# Patient Record
Sex: Female | Born: 1980 | Hispanic: Yes | Marital: Married | Smoking: Never smoker
Health system: Southern US, Community
[De-identification: ages and names within clinical notes are randomized; demographics above are authoritative.]

## PROBLEM LIST (undated history)

## (undated) ENCOUNTER — Inpatient Hospital Stay (HOSPITAL_COMMUNITY): Payer: Self-pay

## (undated) DIAGNOSIS — I10 Essential (primary) hypertension: Secondary | ICD-10-CM

## (undated) HISTORY — DX: Essential (primary) hypertension: I10

---

## 2010-06-06 ENCOUNTER — Encounter: Payer: Self-pay | Admitting: Geriatric Medicine

## 2010-11-09 ENCOUNTER — Other Ambulatory Visit: Payer: Self-pay | Admitting: Geriatric Medicine

## 2010-11-09 DIAGNOSIS — N63 Unspecified lump in unspecified breast: Secondary | ICD-10-CM

## 2010-11-13 ENCOUNTER — Ambulatory Visit
Admission: RE | Admit: 2010-11-13 | Discharge: 2010-11-13 | Disposition: A | Discharge status: Home / Self Care | Payer: Self-pay | Source: Ambulatory Visit | Attending: Geriatric Medicine | Admitting: Geriatric Medicine

## 2010-11-13 ENCOUNTER — Other Ambulatory Visit: Payer: Self-pay | Admitting: Geriatric Medicine

## 2010-11-13 DIAGNOSIS — N63 Unspecified lump in unspecified breast: Secondary | ICD-10-CM

## 2012-05-17 NOTE — L&D Delivery Note (Signed)
Attestation of Attending Supervision of Advanced Practitioner (PA/CNM/NP): Evaluation and management procedures were performed by the Advanced Practitioner under my supervision and collaboration.  I have reviewed the Advanced Practitioner's note and chart, and I agree with the management and plan.  Fahed Morten, MD, FACOG Attending Obstetrician & Gynecologist Faculty Practice, [facility]  

## 2012-05-17 NOTE — L&D Delivery Note (Signed)
Delivery Note Pt became complete and pushed well and at 9:27 PM a viable female was delivered via Vaginal, Spontaneous Delivery (Presentation: ; Occiput Anterior).  APGAR: 9, 9; weight: pending. Infant lifted to pt's abd and dried. Cord clamped and cut by FOB. Hospital cord blood sample collected.  Placenta status: Intact, Spontaneous.  Cord: 3 vessels   Anesthesia: Epidural  Episiotomy: None Lacerations: 1st degree;Perineal Suture Repair: 3.0 vicryl Est. Blood Loss (mL): 200  Mom to postpartum.  Baby to Couplet care / Skin to Skin.  Kari Thomas 04/11/2013, 10:04 PM

## 2012-09-18 LAB — OB RESULTS CONSOLE HEPATITIS B SURFACE ANTIGEN: Hepatitis B Surface Ag: NEGATIVE

## 2012-09-18 LAB — GLUCOSE, 1 HOUR: Glucose, 1 hour: 85

## 2012-09-18 LAB — OB RESULTS CONSOLE HGB/HCT, BLOOD: Hemoglobin: 13.1 g/dL

## 2012-09-18 LAB — OB RESULTS CONSOLE GC/CHLAMYDIA: Gonorrhea: NEGATIVE

## 2012-09-18 LAB — OB RESULTS CONSOLE ABO/RH: RH Type: NEGATIVE

## 2012-10-04 ENCOUNTER — Encounter: Payer: Self-pay | Admitting: *Deleted

## 2012-10-19 ENCOUNTER — Encounter: Payer: Self-pay | Admitting: Obstetrics & Gynecology

## 2012-10-19 ENCOUNTER — Ambulatory Visit (INDEPENDENT_AMBULATORY_CARE_PROVIDER_SITE_OTHER): Payer: Self-pay | Admitting: Obstetrics & Gynecology

## 2012-10-19 VITALS — BP 156/91 | Temp 97.9°F | Wt 205.8 lb

## 2012-10-19 DIAGNOSIS — O10019 Pre-existing essential hypertension complicating pregnancy, unspecified trimester: Secondary | ICD-10-CM

## 2012-10-19 DIAGNOSIS — O10012 Pre-existing essential hypertension complicating pregnancy, second trimester: Secondary | ICD-10-CM

## 2012-10-19 DIAGNOSIS — O0992 Supervision of high risk pregnancy, unspecified, second trimester: Secondary | ICD-10-CM

## 2012-10-19 LAB — POCT URINALYSIS DIP (DEVICE)
Leukocytes, UA: NEGATIVE
Nitrite: NEGATIVE
Protein, ur: NEGATIVE mg/dL
Urobilinogen, UA: 0.2 mg/dL (ref 0.0–1.0)
pH: 7 (ref 5.0–8.0)

## 2012-10-19 NOTE — Progress Notes (Signed)
Pulse- 102  Edema-feet  Pain-stomach, when nervous has chest pain Weight gain 11-20lbs New ob packet given BP Recheck 145/102

## 2012-10-19 NOTE — Patient Instructions (Signed)
Embarazo - Segundo trimestre (Pregnancy - Second Trimester) El segundo trimestre del embarazo (del 3 al 6 mes) es un perodo de evolucin rpida para usted y el beb. Hacia el final del sexto mes, el beb mide aproximadamente 23 cm y pesa 680 g. Comenzar a sentir los movimientos del beb entre las 18 y las 20 semanas de embarazo. Podr sentir las pataditas ("quickening en ingls"). Hay un rpido aumento de peso. Puede segregar un lquido claro (calostro) de las mamas. Quizs sienta pequeas contracciones en el vientre (tero) Esto se conoce como falso trabajo de parto o contracciones de Braxton-Hicks. Es como una prctica del trabajo de parto que se produce cuando el beb est listo para salir. Generalmente los problemas de vmitos matinales ya se han superado hacia el final del primer trimestre. Algunas mujeres desarrollan pequeas manchas oscuras (que se denominan cloasma, mscara del embarazo) en la cara que normalmente se van luego del nacimiento del beb. La exposicin al sol empeora las manchas. Puede desarrollarse acn en algunas mujeres embarazadas, y puede desaparecer en aquellas que ya tienen acn. EXAMENES PRENATALES  Durante los exmenes prenatales, deber seguir realizando pruebas de sangre, segn avance el embarazo. Estas pruebas se realizan para controlar su salud y la del beb. Tambin se realizan anlisis de sangre para conocer los niveles de hemoglobina. La anemia (bajo nivel de hemoglobina) es frecuente durante el embarazo. Para prevenirla, se administran hierro y vitaminas. Tambin se le realizarn exmenes para saber si tiene diabetes entre las 24 y las 28 semanas del embarazo. Podrn repetirle algunas de las pruebas que le hicieron previamente.  En cada visita le medirn el tamao del tero. Esto se realiza para asegurarse de que el beb est creciendo correctamente de acuerdo al estado del embarazo.  Tambin en cada visita prenatal controlarn su presin arterial. Esto se realiza  para asegurarse de que no tenga toxemia.  Se controlar su orina para asegurarse de que no tenga infecciones, diabetes o protena en la orina.  Se controlar su peso regularmente para asegurarse que el aumento ocurre al ritmo indicado. Esto se hace para asegurarse que usted y el beb tienen una evolucin normal.  En algunas ocasiones se realiza una prueba de ultrasonido para confirmar el correcto desarrollo y evolucin del beb. Esta prueba se realiza con ondas sonoras inofensivas para el beb, de modo que el profesional pueda calcular ms precisamente la fecha del parto. Algunas veces se realizan pruebas especializadas del lquido amnitico que rodea al beb. Esta prueba se denomina amniocentesis. El lquido amnitico se obtiene introduciendo una aguja en el vientre (abdomen). Se realiza para controlar los cromosomas en aquellos casos en los que existe alguna preocupacin acerca de algn problema gentico que pueda sufrir el beb. En ocasiones se lleva a cabo cerca del final del embarazo, si es necesario inducir al parto. En este caso se realiza para asegurarse que los pulmones del beb estn lo suficientemente maduros como para que pueda vivir fuera del tero. CAMBIOS QUE OCURREN EN EL SEGUNDO TRIMESTRE DEL EMBARAZO Su organismo atravesar numerosos cambios durante el embarazo. Estos pueden variar de una persona a otra. Converse con el profesional que la asiste acerca los cambios que usted note y que la preocupen.  Durante el segundo trimestre probablemente sienta un aumento del apetito. Es normal tener "antojos" de ciertas comidas. Esto vara de una persona a otra y de un embarazo a otro.  El abdomen inferior comenzar a abultarse.  Podr tener la necesidad de orinar con ms frecuencia debido a   que el tero y el beb presionan sobre la vejiga. Tambin es frecuente contraer ms infecciones urinarias durante el embarazo. Puede evitarlas bebiendo gran cantidad de lquidos y vaciando la vejiga antes y  despus de mantener relaciones sexuales.  Podrn aparecer las primeras estras en las caderas, abdomen y mamas. Estos son cambios normales del cuerpo durante el embarazo. No existen medicamentos ni ejercicios que puedan prevenir estos cambios.  Es posible que comience a desarrollar venas inflamadas y abultadas (varices) en las piernas. El uso de medias de descanso, elevar sus pies durante 15 minutos, 3 a 4 veces al da y limitar la sal en su dieta ayuda a aliviar el problema.  Podr sentir acidez gstrica a medida que el tero crece y presiona contra el estmago. Puede tomar anticidos, con la autorizacin de su mdico, para aliviar este problema. Tambin es til ingerir pequeas comidas 4 a 5 veces al da.  La constipacin puede tratarse con un laxante o agregando fibra a su dieta. Beber grandes cantidades de lquidos, comer vegetales, frutas y granos integrales es de gran ayuda.  Tambin es beneficioso practicar actividad fsica. Si ha sido una persona activa hasta el embarazo, podr continuar con la mayora de las actividades durante el mismo. Si ha sido menos activa, puede ser beneficioso que comience con un programa de ejercicios, como realizar caminatas.  Puede desarrollar hemorroides hacia el final del segundo trimestre. Tomar baos de asiento tibios y utilizar cremas recomendadas por el profesional que lo asiste sern de ayuda para los problemas de hemorroides.  Tambin podr sentir dolor de espalda durante este momento de su embarazo. Evite levantar objetos pesados, utilice zapatos de taco bajo y mantenga una buena postura para ayudar a reducir los problemas de espalda.  Algunas mujeres embarazadas desarrollan hormigueo y adormecimiento de la mano y los dedos debido a la hinchazn y compresin de los ligamentos de la mueca (sndrome del tnel carpiano). Esto desaparece una vez que el beb nace.  Como sus pechos se agrandan, necesitar un sujetador ms grande. Use un sostn de soporte,  cmodo y de algodn. No utilice un sostn para amamantar hasta el ltimo mes de embarazo si va a amamantar al beb.  Podr observar una lnea oscura desde el ombligo hacia la zona pbica denominada linea nigra.  Podr observar que sus mejillas se ponen coloradas debido al aumento de flujo sanguneo en la cara.  Podr desarrollar "araitas" en la cara, cuello y pecho. Esto desaparece una vez que el beb nace. INSTRUCCIONES PARA EL CUIDADO DOMICILIARIO  Es extremadamente importante que evite el cigarrillo, hierbas medicinales, alcohol y las drogas no prescriptas durante el embarazo. Estas sustancias qumicas afectan la formacin y el desarrollo del beb. Evite estas sustancias durante todo el embarazo para asegurar el nacimiento de un beb sano.  La mayor parte de los cuidados que se aconsejan son los mismos que los indicados para el primer trimestre del embarazo. Cumpla con las citas tal como se le indic. Siga las instrucciones del profesional que lo asiste con respecto al uso de los medicamentos, el ejercicio y la dieta.  Durante el embarazo debe obtener nutrientes para usted y para su beb. Consuma alimentos balanceados a intervalos regulares. Elija alimentos como carne, pescado, leche y otros productos lcteos descremados, vegetales, frutas, panes integrales y cereales. El profesional le informar cul es el aumento de peso ideal.  Las relaciones sexuales fsicas pueden continuarse hasta cerca del fin del embarazo si no existen otros problemas. Estos problemas pueden ser la prdida temprana (  prematura) de lquido amnitico de las membranas, sangrado vaginal, dolor abdominal u otros problemas mdicos o del embarazo.  Realice actividad fsica todos los das, si no tiene restricciones. Consulte con el profesional que la asiste si no sabe con certeza si determinados ejercicios son seguros. El mayor aumento de peso tiene lugar durante los ltimos 2 trimestres del embarazo. El ejercicio la ayudar  a:  Controlar su peso.  Ponerla en forma para el parto.  Ayudarla a perder peso luego de haber dado a luz.  Use un buen sostn o como los que se usan para hacer deportes para aliviar la sensibilidad de las mamas. Tambin puede serle til si lo usa mientras duerme. Si pierde calostro, podr utilizar apsitos en el sostn.  No utilice la baera con agua caliente, baos turcos y saunas durante el embarazo.  Utilice el cinturn de seguridad sin excepcin cuando conduzca. Este la proteger a usted y al beb en caso de accidente.  Evite comer carne cruda, queso crudo, y el contacto con los utensilios y desperdicios de los gatos. Estos elementos contienen grmenes que pueden causar defectos de nacimiento en el beb.  El segundo trimestre es un buen momento para visitar a su dentista y evaluar su salud dental si an no lo ha hecho. Es importante mantener los dientes limpios. Utilice un cepillo de dientes blando. Cepllese ms suavemente durante el embarazo.  Es ms fcil perder algo de orina durante el embarazo. Apretar y fortalecer los msculos de la pelvis la ayudar con este problema. Practique detener la miccin cuando est en el bao. Estos son los mismos msculos que necesita fortalecer. Son tambin los mismos msculos que utiliza cuando trata de evitar los gases. Puede practicar apretando estos msculos 10 veces, y repetir esto 3 veces por da aproximadamente. Una vez que conozca qu msculos debe apretar, no realice estos ejercicios durante la miccin. Puede favorecerle una infeccin si la orina vuelve hacia atrs.  Pida ayuda si tiene necesidades econmicas, de asesoramiento o nutricionales durante el embarazo. El profesional podr ayudarla con respecto a estas necesidades, o derivarla a otros especialistas.  La piel puede ponerse grasa. Si esto sucede, lvese la cara con un jabn suave, utilice un humectante no graso y maquillaje con base de aceite o crema. CONSUMO DE MEDICAMENTOS Y DROGAS  DURANTE EL EMBARAZO  Contine tomando las vitaminas apropiadas para esta etapa tal como se le indic. Las vitaminas deben contener un miligramo de cido flico y deben suplementarse con hierro. Guarde todas las vitaminas fuera del alcance de los nios. La ingestin de slo un par de vitaminas o tabletas que contengan hierro puede ocasionar la muerte en un beb o en un nio pequeo.  Evite el uso de medicamentos, inclusive los de venta libre y hierbas que no hayan sido prescriptos o indicados por el profesional que la asiste. Algunos medicamentos pueden causar problemas fsicos al beb. Utilice los medicamentos de venta libre o de prescripcin para el dolor, el malestar o la fiebre, segn se lo indique el profesional que lo asiste. No utilice aspirina.  El consumo de alcohol est relacionado con ciertos defectos de nacimiento. Esto incluye el sndrome de alcoholismo fetal. Debe evitar el consumo de alcohol en cualquiera de sus formas. El cigarrillo causa nacimientos prematuros y bebs de bajo peso. El uso de drogas recreativas est absolutamente prohibido. Son muy nocivas para el beb. Un beb que nace de una madre adicta, ser adicto al nacer. Ese beb tendr los mismos sntomas de abstinencia que un adulto.    Infrmele al profesional si consume alguna droga.  No consuma drogas ilegales. Pueden causarle mucho dao al beb. SOLICITE ATENCIN MDICA SI: Tiene preguntas o preocupaciones durante su embarazo. Es mejor que llame para consultar las dudas que esperar hasta su prxima visita prenatal. De esta forma se sentir ms tranquila.  SOLICITE ATENCIN MDICA DE INMEDIATO SI:  La temperatura oral se eleva sin motivo por encima de 102 F (38.9 C) o segn le indique el profesional que lo asiste.  Tiene una prdida de lquido por la vagina (canal de parto). Si sospecha una ruptura de las membranas, tmese la temperatura y llame al profesional para informarlo sobre esto.  Observa unas pequeas manchas,  una hemorragia vaginal o elimina cogulos. Notifique al profesional acerca de la cantidad y de cuntos apsitos est utilizando. Unas pequeas manchas de sangre son algo comn durante el embarazo, especialmente despus de mantener relaciones sexuales.  Presenta un olor desagradable en la secrecin vaginal y observa un cambio en el color, de transparente a blanco.  Contina con las nuseas y no obtiene alivio de los remedios indicados. Vomita sangre o algo similar a la borra del caf.  [place] o sube ms de 900 g. en una semana, o segn lo indicado por el profesional que la asiste.  Observa que se le hinchan el rostro, las manos, los pies o las piernas.  Ha estado expuesta a la rubola y no ha sufrido la enfermedad.  Ha estado expuesta a la quinta enfermedad o a la varicela.  Presenta dolor abdominal. Las molestias en el ligamento redondo son una causa no cancerosa (benigna) frecuente de dolor abdominal durante el embarazo. El profesional que la asiste deber evaluarla.  Presenta dolor de cabeza intenso que no se alivia.  Presenta fiebre, diarrea, dolor al orinar o le falta la respiracin.  Presenta dificultad para ver, visin borrosa, o visin doble.  Sufre una cada, un accidente de trnsito o cualquier tipo de trauma.  Vive en un hogar en el que existe violencia fsica o mental. Document Released: 02/10/2005 Document Revised: 01/26/2012 [employer] Patient Information 2014 [employer].  

## 2012-10-19 NOTE — Progress Notes (Signed)
Nutrition Note:  1st visit Pt. Seen for GDM and HTN education Wt. Loss during pregnancy.  Weighed 205.8# today; up slightly from [facility] visit 09/07/12. Pt. Given verbal and written GDM education.  N/V reported.  No food allergies.  Eats 3 meals and 2-3 snacks/day.  Craving sweets.  Drinks water daily. Discussed importance of eating 3 meals and 2-3 snacks daily; limiting sugar intake and sodium through processed foods. Discussed weight gain goals of 11-20# F/U in 2 weeks. Candice C. Earlene Plater, MPH, RD, LDN

## 2012-10-19 NOTE — Progress Notes (Signed)
U/S scheduled 10/26/12 at 945 am.

## 2012-10-19 NOTE — Progress Notes (Signed)
Transferred from [facility] with history of HTN, no meds. BP elevated today. Will recheck next visit to consider antihypertensive. Needs [place] for dating, LMP unsure and menses irregular  Subjective: [facility] referrall    Kari Thomas Shon Hale is a G2P1001 [redacted]w[redacted]d being seen today for her first obstetrical visit.  Her obstetrical history is significant for hypertension, not on meds. Patient does intend to breast feed. Pregnancy history fully reviewed.  Patient reports nausea.  Filed Vitals:   10/19/12 0819  BP: 156/91  Temp: 97.9 F (36.6 C)  Weight: 205 lb 12.8 oz (93.35 kg)    HISTORY: OB History   Grav Para Term Preterm Abortions TAB SAB Ect Mult Living   2 1 1       1      # Outc Date GA Lbr Len/2nd Wgt Sex Del Anes PTL Lv   1 TRM 9/06 [redacted]w[redacted]d 13:00 6lb12oz(3.062kg) M SVD EPI  Yes   Comments: chest pain; pre-eclampsia   2 CUR              Past Medical History  Diagnosis Date  . Hypertension   . Gestational diabetes 2014   History reviewed. No pertinent past surgical history. Family History  Problem Relation Age of Onset  . Diabetes Mother   . Heart disease Mother   . Cancer Father     liver cancer     Exam   Filed Vitals:   10/19/12 0819  BP: 156/91  Temp: 97.9 F (36.6 C)  Weight: 205 lb 12.8 oz (93.35 kg)     Uterus:     Pelvic Exam:                                    Skin: normal coloration and turgor, no rashes    Neurologic: oriented, normal, normal mood   Extremities: normal strength, tone, and muscle mass, no deformities   HEENT neck supple with midline trachea   Mouth/Teeth dental hygiene good   Neck supple   Cardiovascular: Normal effort       Abdomen: obese, not tender, 15 week size     Assessment:    Pregnancy: G2P1001 Patient Active Problem List   Diagnosis Date Noted  . Supervision of high risk pregnancy in second trimester 10/19/2012  . Hypertension, essential, complicating pregnancy 10/19/2012        Plan:     Initial labs  drawn. Prenatal vitamins. Problem list reviewed and updated. Genetic Screening discussed First Screen: discussed, needs dating [place].  Ultrasound discussed; fetal survey: requested.  Follow up in 2 weeks. 50% of 30 min visit spent on counseling and coordination of care.  May need BP med   Lachele Lievanos 10/19/2012

## 2012-10-26 ENCOUNTER — Ambulatory Visit (HOSPITAL_COMMUNITY)
Admission: RE | Admit: 2012-10-26 | Discharge: 2012-10-26 | Disposition: A | Discharge status: Home / Self Care | Payer: Self-pay | Source: Ambulatory Visit | Attending: Obstetrics & Gynecology | Admitting: Obstetrics & Gynecology

## 2012-10-26 ENCOUNTER — Other Ambulatory Visit: Payer: Self-pay | Admitting: Obstetrics & Gynecology

## 2012-10-26 DIAGNOSIS — Z3689 Encounter for other specified antenatal screening: Secondary | ICD-10-CM | POA: Insufficient documentation

## 2012-10-26 DIAGNOSIS — O10012 Pre-existing essential hypertension complicating pregnancy, second trimester: Secondary | ICD-10-CM

## 2012-10-26 DIAGNOSIS — O0992 Supervision of high risk pregnancy, unspecified, second trimester: Secondary | ICD-10-CM

## 2012-10-26 DIAGNOSIS — O99810 Abnormal glucose complicating pregnancy: Secondary | ICD-10-CM | POA: Insufficient documentation

## 2012-10-26 DIAGNOSIS — O10019 Pre-existing essential hypertension complicating pregnancy, unspecified trimester: Secondary | ICD-10-CM | POA: Insufficient documentation

## 2012-11-02 ENCOUNTER — Ambulatory Visit (INDEPENDENT_AMBULATORY_CARE_PROVIDER_SITE_OTHER): Payer: Self-pay | Admitting: Family Medicine

## 2012-11-02 ENCOUNTER — Encounter: Payer: Self-pay | Admitting: Family Medicine

## 2012-11-02 VITALS — BP 120/77 | Temp 99.3°F | Wt 206.0 lb

## 2012-11-02 DIAGNOSIS — O0992 Supervision of high risk pregnancy, unspecified, second trimester: Secondary | ICD-10-CM

## 2012-11-02 DIAGNOSIS — O10019 Pre-existing essential hypertension complicating pregnancy, unspecified trimester: Secondary | ICD-10-CM

## 2012-11-02 DIAGNOSIS — O10012 Pre-existing essential hypertension complicating pregnancy, second trimester: Secondary | ICD-10-CM

## 2012-11-02 LAB — POCT URINALYSIS DIP (DEVICE)
Bilirubin Urine: NEGATIVE
Glucose, UA: NEGATIVE mg/dL
Ketones, ur: NEGATIVE mg/dL
Nitrite: NEGATIVE
Specific Gravity, Urine: 1.02 (ref 1.005–1.030)

## 2012-11-02 NOTE — Progress Notes (Signed)
P=93, c/o pelvic pressure sometimes, used interpreter Dorita,

## 2012-11-02 NOTE — Progress Notes (Signed)
BP better Quad screen today Anatomy scheduled Baseline labs for HTN

## 2012-11-02 NOTE — Patient Instructions (Signed)
Embarazo - Segundo trimestre (Pregnancy - Second Trimester) El segundo trimestre del Psychiatrist (del 3 al 6 mes) es un perodo de evolucin rpida para usted y el beb. Hacia el final del sexto mes, el beb mide aproximadamente 23 cm y pesa 680 g. Comenzar a Pharmacologist del beb [place] 18 y las 20 [address] de [place]. Podr sentir las pataditas ("quickening en ingls"). Hay un rpido [employer]. Puede segregar un lquido claro Charity fundraiser) de las [place]. Quizs sienta pequeas contracciones en el vientre (tero) Esto se conoce como falso trabajo de parto o contracciones de Braxton-Hicks. Es como una prctica del trabajo de parto que se produce cuando el beb est listo para salir. Generalmente los problemas de vmitos matinales ya se han superado hacia el final del Medical sales representative. Algunas mujeres desarrollan pequeas manchas oscuras (que se denominan cloasma, mscara del embarazo) en la cara que normalmente se van luego del nacimiento del beb. La exposicin al sol empeora las manchas. Puede desarrollarse acn en algunas mujeres embarazadas, y puede desaparecer en aquellas que ya tienen acn. EXAMENES PRENATALES  Durante los Manpower Inc, deber seguir realizando pruebas de [place], segn avance el [place]. Estas pruebas se realizan para controlar su salud y la del beb. Tambin se realizan anlisis de sangre para [employer] niveles de Mark. La anemia (bajo nivel de hemoglobina) es frecuente durante el embarazo. Para prevenirla, se administran hierro y vitaminas. Tambin se le realizarn exmenes para saber si tiene diabetes entre las 24 y las 28 semanas del Realitos. Podrn repetirle algunas de las Hovnanian Enterprises hicieron previamente.  En cada visita le medirn el tamao del tero. Esto se realiza para asegurarse de que el beb est creciendo correctamente de acuerdo al estado del [place].  Tambin en cada visita prenatal controlarn su presin arterial. Esto se realiza  para asegurarse de que no tenga toxemia.  Se controlar su orina para asegurarse de que no tenga infecciones, diabetes o protena en la orina.  Se controlar su peso regularmente para asegurarse que el aumento ocurre al ritmo indicado. Esto se hace para asegurarse que usted y el beb tienen una evolucin normal.  En algunas ocasiones se realiza una prueba de ultrasonido para confirmar el correcto desarrollo y evolucin del beb. Esta prueba se realiza con ondas sonoras inofensivas para el beb, de modo que el profesional pueda calcular ms precisamente la fecha del [place]. Algunas veces se realizan pruebas especializadas del lquido amnitico que rodea al beb. Esta prueba se denomina amniocentesis. El lquido amnitico se obtiene introduciendo una aguja en el vientre (abdomen). Se realiza para Conservator, museum/gallery en los que existe alguna preocupacin acerca de algn problema gentico que pueda sufrir el beb. En ocasiones se lleva a cabo cerca del final del embarazo, si es necesario inducir al Apple Computer. En este caso se realiza para asegurarse que los pulmones del beb estn lo suficientemente maduros como para que pueda vivir fuera del tero. CAMBIOS QUE OCURREN EN EL SEGUNDO TRIMESTRE DEL EMBARAZO Su organismo atravesar numerosos cambios durante el Big Lots. Estos pueden variar de Neomia Dear persona a otra. [place] con el profesional que la asiste acerca los cambios que usted note y que la preocupen.  Durante el segundo trimestre probablemente sienta un aumento del apetito. Es normal tener "antojos" de Development worker, community. Esto vara de Neomia Dear persona a otra y de un embarazo a Therapist, art.  El abdomen inferior comenzar a abultarse.  Podr tener la necesidad de Geographical information systems officer con ms frecuencia debido a  que el tero y el beb presionan sobre la vejiga. Tambin es frecuente contraer ms infecciones urinarias durante el [employer] Lots. Puede evitarlas bebiendo gran cantidad de lquidos y vaciando la vejiga antes y  despus de Sales promotion account executive.  Podrn aparecer las primeras estras en las caderas, abdomen y [place]. Estos son cambios normales del cuerpo durante el [place]. No existen medicamentos ni ejercicios que puedan prevenir CarMax.  Es posible que comience a desarrollar venas inflamadas y abultadas (varices) en las piernas. El uso de medias de descanso, Optometrist sus pies durante 15 minutos, 3 a 4 veces al da y Film/video editor la sal en su dieta ayuda a Journalist, newspaper.  Podr sentir Engineering geologist gstrica a medida que el tero crece y Doctor, general practice. Puede tomar anticidos, con la autorizacin de su mdico, para Financial planner. Tambin es til ingerir pequeas comidas 4 a 5 veces al Futures trader.  La constipacin puede tratarse con un laxante o agregando fibra a su dieta. Beber grandes cantidades de lquidos, comer vegetales, frutas y granos integrales es de [place].  Tambin es beneficioso practicar actividad fsica. Si ha sido una persona Engineer, mining, podr continuar con la Harley-Davidson de las actividades durante el mismo. Si ha sido [employer], puede ser beneficioso que comience con un programa de ejercicios, Museum/gallery exhibitions officer.  Puede desarrollar hemorroides hacia el final del segundo trimestre. Tomar baos de asiento tibios y Chemical engineer cremas recomendadas por el profesional que lo asiste sern de ayuda para los problemas de hemorroides.  Tambin podr Financial risk analyst de espalda durante este momento de su embarazo. Evite levantar objetos pesados, utilice zapatos de taco bajo y [place] buena postura para ayudar a reducir los problemas de [place].  Algunas mujeres embarazadas desarrollan hormigueo y adormecimiento de la mano y los dedos debido a la hinchazn y compresin de los ligamentos de la mueca (sndrome del tnel carpiano). Esto desaparece una vez que el beb nace.  Como sus pechos se agrandan, Pension scheme manager un sujetador ms grande. Use un sostn de soporte,  cmodo y de algodn. No utilice un sostn para amamantar hasta el ltimo mes de embarazo si va a amamantar al beb.  Podr observar una lnea oscura desde el ombligo hacia la zona pbica denominada linea nigra.  Podr observar que sus mejillas se ponen coloradas debido al aumento de flujo sanguneo en la cara.  Podr desarrollar "araitas" en la cara, cuello y pecho. Esto desaparece una vez que el beb nace. INSTRUCCIONES PARA EL CUIDADO DOMICILIARIO  Es extremadamente importante que evite el cigarrillo, hierbas medicinales, alcohol y las drogas no prescriptas durante el Psychiatrist. Estas sustancias qumicas afectan la formacin y el desarrollo del beb. Evite estas sustancias durante todo el embarazo para asegurar el nacimiento de un beb sano.  La mayor parte de los cuidados que se aconsejan son los mismos que los indicados para Financial risk analyst trimestre del Psychiatrist. Cumpla con las citas tal como se le indic. Siga las instrucciones del profesional que lo asiste con respecto al uso de los medicamentos, el ejercicio y Psychologist, forensic.  Durante el embarazo debe obtener nutrientes para usted y para su beb. Consuma alimentos balanceados a intervalos regulares. Elija alimentos como carne, pescado, [place] y otros productos lcteos descremados, vegetales, frutas, panes integrales y cereales. El Equities trader cul es el aumento de peso ideal.  Las relaciones sexuales fsicas pueden continuarse hasta cerca del fin del embarazo si no existen otros problemas. Estos problemas pueden ser la prdida temprana (  prematura) de lquido amnitico de las [place], sangrado vaginal, dolor abdominal u otros problemas mdicos o del Psychiatrist.  Realice Tesoro Corporation, si no tiene restricciones. Consulte con el profesional que la asiste si no sabe con certeza si determinados ejercicios son seguros. El mayor aumento de peso tiene Environmental consultant durante los ltimos 2 trimestres del Psychiatrist. El ejercicio la ayudar  a:  Engineering geologist.  Ponerla en forma para el parto.  Ayudarla a perder peso luego de haber dado a luz.  Use un buen sostn o como los que se usan para hacer deportes para Paramedic la sensibilidad de las [place]. Tambin puede serle til si lo [place] mientras duerme. Si pierde Product manager, podr Parker Hann[employer]in.  No utilice la baera con agua caliente, baos turcos y saunas durante el [address]  Utilice el cinturn de seguridad sin excepcin cuando conduzca. Este la proteger a usted y al beb en caso de accidente.  Evite comer carne cruda, queso crudo, y el contacto con los utensilios y desperdicios de los gatos. Estos elementos contienen grmenes que pueden causar defectos de nacimiento en el beb.  El segundo trimestre es un buen momento para visitar a su dentista y Software engineer si an no lo ha hecho. Es Primary school teacher los dientes limpios. Utilice un cepillo de dientes blando. Cepllese ms suavemente durante el embarazo.  Es ms fcil perder algo de orina durante el [place]. Apretar y Chief Operating Officer los msculos de la pelvis la ayudar con este problema. Practique detener la miccin cuando est en el bao. Estos son los mismos msculos que Development worker, international aid. Son TEPP[employer] Partners mismos msculos que utiliza cuando trata de Ryder System gases. Puede practicar apretando estos msculos 10 veces, y repetir esto 3 veces por da aproximadamente. Una vez que conozca qu msculos debe apretar, no realice estos ejercicios durante la miccin. Puede favorecerle una infeccin si la orina vuelve hacia atrs.  Pida ayuda si tiene necesidades econmicas, de asesoramiento o nutricionales durante el [place]. El profesional podr ayudarla con respecto a estas necesidades, o derivarla a otros especialistas.  La piel puede ponerse grasa. Si esto sucede, lvese la cara con un jabn [place], utilice un humectante no graso y [place] con base de aceite o crema. CONSUMO DE MEDICAMENTOS Y DROGAS  DURANTE EL EMBARAZO  Contine tomando las vitaminas apropiadas para esta etapa tal como se le indic. Las vitaminas deben contener un miligramo de cido flico y deben suplementarse con hierro. Guarde todas las vitaminas fuera del alcance de los nios. La ingestin de slo un par de vitaminas o tabletas que contengan hierro puede ocasionar la Newmont Mining en un beb o en un nio pequeo.  Evite el uso de [place], inclusive los de venta libre y hierbas que no hayan sido prescriptos o indicados por el profesional que la asiste. Algunos medicamentos pueden causar problemas fsicos al beb. Utilice los medicamentos de venta libre o de prescripcin para Chief Technology Officer, Environmental health practitioner o la [place], segn se lo indique el profesional que lo asiste. No utilice aspirina.  El consumo de alcohol est relacionado con ciertos defectos de nacimiento. Esto incluye el sndrome de alcoholismo fetal. Debe evitar el consumo de alcohol en cualquiera de sus formas. El cigarrillo causa nacimientos prematuros y bebs de [place]. El uso de drogas recreativas est absolutamente prohibido. Son muy nocivas para el beb. Un beb que nace de [employer] Express, ser adicto al nacer. Ese beb tendr los mismos sntomas de abstinencia que un adulto.  Infrmele al profesional si consume alguna droga.  No consuma drogas ilegales. Pueden causarle mucho dao al beb. SOLICITE ATENCIN MDICA SI: Tiene preguntas o preocupaciones durante su embarazo. Es mejor que llame para Science writer las dudas que esperar hasta su prxima visita prenatal. Thressa Sheller forma se sentir ms tranquila.  SOLICITE ATENCIN MDICA DE INMEDIATO SI:  La temperatura oral se eleva sin motivo por encima de 102 F (38.9 C) o segn le indique el profesional que lo asiste.  Tiene una prdida de lquido por la vagina (canal de parto). Si sospecha una ruptura de las [place], tmese la temperatura y llame al profesional para informarlo sobre esto.  Observa unas pequeas manchas,  una hemorragia vaginal o elimina cogulos. Notifique al profesional acerca de la cantidad y de cuntos apsitos est utilizando. Unas pequeas manchas de sangre son algo comn durante el Psychiatrist, especialmente despus de Sales promotion account executive.  Presenta un olor desagradable en la secrecin vaginal y observa un cambio en el color, de transparente a blanco.  Contina con las nuseas y no obtiene alivio de los remedios indicados. Vomita sangre o algo similar a la borra del caf.  [place] o sube ms de 900 g. en una semana, o segn lo indicado por el profesional que la asiste.  Observa que se le Southwest Airlines, las manos, los pies o las piernas.  Ha estado expuesta a la rubola y no ha sufrido la enfermedad.  Ha estado expuesta a la quinta enfermedad o a la varicela.  Presenta dolor abdominal. Las molestias en el ligamento redondo son Neomia Dear causa no cancerosa (benigna) frecuente de dolor abdominal durante el embarazo. El profesional que la asiste deber evaluarla.  Presenta dolor de cabeza intenso que no se [place].  Presenta fiebre, diarrea, dolor al orinar o le falta la respiracin.  Presenta dificultad para ver, visin borrosa, o visin doble.  Sufre una cada, un accidente de trnsito o cualquier tipo de trauma.  Vive en un hogar en el que existe violencia fsica o mental. Document Released: 02/10/2005 Document Revised: 01/26/2012 [facility] Patient Information 2014 [place], [place].  Lactancia materna  (Breastfeeding)  El cambio hormonal durante el Psychiatrist produce el desarrollo del tejido Dean y un aumento en el nmero y tamao de los conductos galactforos. La hormona prolactina permite que las protenas, los azcares y las grasas de la sangre produzcan la [employer] materna en las glndulas productoras de [place]. La hormona progesterona impide que la leche materna sea liberada antes del nacimiento del beb. Despus del nacimiento del beb, su nivel de progesterona disminuye  permitiendo que la leche materna sea [place]. Pensar en el beb, as como la succin o Theatre manager, pueden estimular la liberacin de [place] de las glndulas productoras de [place].  La decisin de Company secretary) es una de las mejores opciones que usted puede hacer para usted y su beb. La informacin que sigue da una breve resea de los beneficios, as Lexicographer que debe saber sobre la [place].  LOS BENEFICIOS DE AMAMANTAR  Para el beb   La primera leche (calostro) ayuda al mejor funcionamiento del sistema digestivo del beb.   La leche tiene anticuerpos que provienen de la madre y que ayudan a prevenir las infecciones en el beb.   El beb tiene una menor incidencia de asma, alergias y del sndrome de muerte sbita del lactante (SMSL).   Los nutrientes de la [place] materna son mejores para el beb que la [place].  La leche materna mejora  el desarrollo cerebral del beb.   Su beb tendr menos gases, clicos y estreimiento.  Es menos probable que el beb desarrolle otras enfermedades, como obesidad infantil, asma o diabetes mellitus. Para usted   La lactancia materna favorece el desarrollo de un vnculo muy especial entre la madre y el beb.   Es ms conveniente, siempre disponible, a la [place] y [place].   La lactancia materna ayuda a quemar caloras y a perder el peso ganado durante el [place].   Hace que el tero se contraiga ms rpidamente a su tamao normal y Consolidated Edison sangrado despus del [place].   Las [employer] Holdings que amamantan tienen menos riesgo de Environmental education officer osteoporosis o cncer de mama o de ovario en el futuro.  FRECUENCIA DEL AMAMANTAMIENTO   Un beb sano, nacido a trmino, puede amamantarse con tanta frecuencia como cada hora, o espaciar las comidas cada tres horas. La frecuencia en la lactancia varan de un beb a otro.   Los recin nacidos deben ser alimentados por lo menos cada 2-3 horas  Administrator y cada 4-5 horas durante la noche. Usted debe amamantarlo un mnimo de 8 tomas en un perodo de 24 horas.  Despierte al beb para amamantarlo si han pasado 3-4 horas desde la ltima comida.  Amamante cuando sienta la necesidad de reducir la plenitud de sus senos o cuando el beb muestre signos de [place]. Las seales de que el beb puede Gentry Fitz son:  Lenora Boys su estado de alerta o vigilancia.  Se estira.  Mueve la cabeza de un lado a otro.  Mueve la cabeza y abre la boca cuando se le toca la mejilla o la boca (reflejo de succin).  Aumenta las vocalizaciones, tales como sonidos de succin, relamerse los labios, arrullos, suspiros, o chirridos.  Mueve la [employer] boca.  Se chupa con ganas los dedos o las manos.  Agitacin.  Llanto intermitente.  Los signos de hambre extrema requerirn que lo calme y lo consuele antes de tratar de alimentarlo. Los signos de hambre extrema son:  Agitacin.  Llanto fuerte e intenso.  Gritos.  El amamantamiento frecuente la ayudar a producir ms [place] y a Education officer, community de Engineer, mining en los pezones e hinchazn de las [place].  LACTANCIA MATERNA   Ya sea que se encuentre acostada o sentada, asegrese que el abdomen del beb est enfrente el suyo.   Sostenga la mama con el pulgar por arriba y los otros 4 dedos por debajo del pezn. Asegrese que sus dedos se encuentren lejos del pezn y de la boca del beb.   Empuje suavemente los labios del beb con el pezn o con el dedo.   Cuando la boca del beb se abra lo suficiente, introduzca el pezn y la zona oscura que lo rodea (areola) tanto como le sea posible dentro de la boca.  Debe haber ms areola visible por arriba del labio superior que por debajo del labio inferior.  La lengua del beb debe estar entre la enca inferior y el seno.  Asegrese de que la boca del beb est en la posicin correcta alrededor del pezn (prendida). Los labios del beb deben crear un sello  sobre su pecho.  Las seales de que el beb se ha prendido eficazmente al pezn son:  Payton Doughty o succiona sin dolor.  Se escucha que traga Lyondell Chemical.  No hace ruidos ni chasquidos.  Hay movimientos musculares por arriba y por delante de sus odos al Printmaker.  El beb debe  succionar unos 2-3 minutos para que salga la [place]. Permita que el nio se alimente en cada mama todo lo que desee. Alimente al beb hasta que se desprenda o se quede dormido en Freight forwarder y luego ofrzcale el segundo pecho.  Las seales de que el beb est lleno y satisfecho son:  Disminuye gradualmente el nmero de succiones o no succiona.  Se queda dormido.  Extiende o relaja su cuerpo.  Retiene una pequea cantidad de [facility] boca.  Se desprende del pecho por s mismo.  Los signos de una lactancia materna eficaz son:  Los senos han aumentado la firmeza, el peso y el tamao antes de la alimentacin.  Son ms blandos despus de amamantar.  Un aumento del volumen de [place], y tambin el cambio de su consistencia y color se producen hacia el quinto da de Tour manager.  La congestin mamaria se [place] al dar de [place].  Los pezones no duelen, ni estn agrietados ni sangran.  De ser necesario, interrumpa la succin poniendo su dedo en la esquina de la boca del beb y deslizando el dedo entre sus encas. A continuacin, retire la mama de su boca.  Es comn que los bebs regurgiten un poco despus de comer.  A menudo los bebs tragan aire al alimentarse. Esto puede hacer que se sienta molesto. Hacer eructar al beb al Pilar Plate de pecho puede ser de [place].  Se recomiendan suplementos de vitamina D para los bebs que reciben slo [address]  Evite el uso del chupete durante las primeras 4 a 6 semanas de vida.  Evite la alimentacin suplementaria con agua, frmula o jugo en lugar de la [employer]gate Pal[employer]. La leche materna es todo el alimento que el beb necesita. No es necesario que el  nio ingiera agua o preparados de bibern. Sus pechos producirn ms leche si se evita la alimentacin suplementaria durante las primeras semanas. COMO SABER SI EL BEB OBTIENE LA SUFICIENTE LECHE MATERNA  Preguntarse si el beb obtiene la cantidad suficiente de [place] es una preocupacin frecuente Luc[employer]. Puede asegurarse que el beb tiene la leche suficiente si:   El beb succiona activamente y usted escucha que traga.   El beb parece estar relajado y satisfecho despus de Psychologist, clinical.   El nio se alimenta al menos 8 a 12 veces en 24 horas.  Durante los primeros 3 a 5 das de vida:  Moja 3-5 paales en 24 horas. La materia fecal debe ser blanda y [place].  Tiene al menos 3 a 4 deposiciones en 24 horas. La materia fecal debe ser blanda y [place].  A los 5-7 das de vida, el beb debe tener al menos 3-6 deposiciones en 24 horas. La materia fecal debe ser grumosa y [place] a los 5 [address] de [place].  Su beb tiene una prdida de Psychologist, counselling a 7al 10% durante los primeros 3 [address]  [address] de [address]  El beb no pierde peso despus de 3-7 [address] de [address]  El beb debe aumentar 4 a 6 libras (120 a 170 gr.) por semana despus de los 4 [address] de vida.  Aumenta de [place] a los [address] de vida y vuelve al peso del nacimiento dentro de las 2 semanas. CONGESTIN MAMARIA  Durante la primera semana despus del [place], usted puede experimentar hinchazn en las mamas (congestin [place]). Al estar congestionadas, las mamas se sienten pesadas, calientes o sensibles al tacto. El pico de la congestin ocurre a las 24 -48 horas despus del parto.  La congestin puede disminuirse:  Continuando con la Tour manager.  Aumentando la frecuencia.  Tomando duchas calientes o aplicando calor hmedo en los senos antes de cada comida. Esto aumenta la circulacin y [place] a que la [place].   Masajeando suavemente el pecho antes y [place] [employer]. Con las yemas de los dedos, masajee la pared del pecho hacia  el pezn en un movimiento circular.   Asegurarse de que el beb vaca al menos uno de sus pechos en cada alimentacin. Tambin ayuda si comienza la siguiente toma en el otro seno.   Extraiga manualmente o con un sacaleches las mamas para vaciar los pechos si el beb tiene sueo o no se aliment bien. Tambin puede extraer la W[employer] Resources cuando vuelva a trabajar o si siente que se estn congestionando las [place].  Asegrese de que el beb se prende y est bien colocado durante la Market researcher. Si sigue estas indicaciones, la congestin debe mejorar en 24 a 48 horas. Si an tiene dificultades, consulte a Barista.  CUDESE USTED MISMA  Cuide sus mamas.   Bese o dchese diariamente.   Evite usar Eaton Corporation.   Use un sostn de soporte Evite el uso de sostenes con aro.  Seque al aire sus pezones durante 3-4 minutos despus de cada comida.   Utilice slo apsitos de algodn en el sostn para absorber las prdidas de [place]. La prdida de un poco de Deere & Company las comidas es normal.   Use solamente lanolina pura en sus pezones despus de Museum/gallery exhibitions officer. Usted no tiene que lavarla antes de alimentar al beb. Otra opcin es sacarse unas gotas de [place] y Pep[employer] Holdings pezones.  Continuar con los autocontroles de la mama. Cudese.   Consuma alimentos saludables. Alterne 3 comidas con 3 colaciones.  Evite los alimentos que usted nota que perjudican al beb.  Dixie Dials, jugos de fruta y agua para Patent examiner su sed (aproximadamente 8 vasos al Futures trader).   Descanse con frecuencia, reljese y tome sus vitaminas prenatales para evitar la fatiga, el estrs y la anemia.  Evite masticar y fumar tabaco.  Evite el consumo de alcohol y drogas.  Tome medicamentos de venta libre y recetados tal como le indic su mdico o Social research officer, government. Siempre debe consultar con su mdico o farmacutico antes de tomar cualquier medicamento, vitamina o suplemento de hierbas.  Sepa que  durante la lactancia puede quedar embarazada. Si lo desea, hable con su mdico acerca de la planificacin familiar y los mtodos anticonceptivos seguros que puede utilizar durante la Market researcher. SOLICITE ATENCIN MDICA SI:   Usted siente que quiere dejar de Museum/gallery exhibitions officer o se siente frustrada con la lactancia.  Siente dolor en los senos o en los pezones.  Sus pezones estn agrietados o Water quality scientist.  Sus pechos estn irritados, sensibles o calientes.  Tiene un rea hinchada en cualquiera de los senos.  Siente escalofros o fiebre.  Tiene nuseas o vmitos.  Observa un drenaje en los pezones.  Sus mamas no se llenan antes de Marine scientist al 5to da despus del [place].  Se siente triste y deprimida.  El nio est demasiado somnoliento como para comer.  El nio tiene problemas para Industrial/product designer.   Moja menos de 3 paales en 24 horas.  Mueve el intestino menos de 3 veces en 24 horas.  La piel del beb o la parte blanca de sus ojos est ms amarilla.   El beb no ha aumentado de [place] a los [address] de [place].  ASEGRESE DE QUE:   Comprende estas instrucciones.  Controlar su enfermedad.  Solicitar ayuda de inmediato si no mejora o si empeora. Document Released: 05/03/2005 Document Revised: 01/26/2012 [facility] Patient Information 2014 [place], [place].

## 2012-11-02 NOTE — Progress Notes (Signed)
Nutrition note: f/u visit Pt has gained 0.2# since last appt 10/19/12 but has lost 14# total. Pt reports eating 3 meals & 3-4 snacks/d. Pt is taking PNV. Pt reports having severe N/V, which is probably one reason for her wt loss. Pt received verbal & written education about tips to decrease N/V and energy dense snacks in Spanish via [employer], interpreter. Discussed wt gain goals of 11-20# or 0.5#/wk. F/u in 4-6 wks Blondell Reveal, MS, RD, LDN

## 2012-11-06 ENCOUNTER — Other Ambulatory Visit: Payer: Self-pay

## 2012-11-06 DIAGNOSIS — O10012 Pre-existing essential hypertension complicating pregnancy, second trimester: Secondary | ICD-10-CM

## 2012-11-06 LAB — COMPREHENSIVE METABOLIC PANEL
ALT: 13 U/L (ref 0–35)
Alkaline Phosphatase: 37 U/L — ABNORMAL LOW (ref 39–117)
CO2: 25 mEq/L (ref 19–32)
Creat: 0.41 mg/dL — ABNORMAL LOW (ref 0.50–1.10)
Total Bilirubin: 0.2 mg/dL — ABNORMAL LOW (ref 0.3–1.2)

## 2012-11-07 ENCOUNTER — Encounter: Payer: Self-pay | Admitting: Family Medicine

## 2012-11-07 LAB — CREATININE CLEARANCE, URINE, 24 HOUR
Creatinine Clearance: 229 mL/min — ABNORMAL HIGH (ref 75–115)
Creatinine, 24H Ur: 1351 mg/d (ref 700–1800)
Creatinine, Urine: 110.3 mg/dL
Creatinine: 0.41 mg/dL — ABNORMAL LOW (ref 0.50–1.10)

## 2012-11-07 LAB — PROTEIN, URINE, 24 HOUR: Protein, 24H Urine: 49 mg/d — ABNORMAL LOW (ref 50–100)

## 2012-11-14 ENCOUNTER — Encounter: Payer: Self-pay | Admitting: *Deleted

## 2012-11-14 ENCOUNTER — Encounter: Payer: Self-pay | Admitting: Family Medicine

## 2012-11-16 ENCOUNTER — Ambulatory Visit (HOSPITAL_COMMUNITY): Admission: RE | Admit: 2012-11-16 | Payer: Self-pay | Source: Ambulatory Visit

## 2012-11-27 ENCOUNTER — Ambulatory Visit (INDEPENDENT_AMBULATORY_CARE_PROVIDER_SITE_OTHER): Payer: Self-pay | Admitting: Family Medicine

## 2012-11-27 VITALS — BP 130/88 | Temp 97.8°F | Wt 211.0 lb

## 2012-11-27 DIAGNOSIS — O10012 Pre-existing essential hypertension complicating pregnancy, second trimester: Secondary | ICD-10-CM

## 2012-11-27 DIAGNOSIS — O10019 Pre-existing essential hypertension complicating pregnancy, unspecified trimester: Secondary | ICD-10-CM

## 2012-11-27 LAB — POCT URINALYSIS DIP (DEVICE)
Glucose, UA: NEGATIVE mg/dL
Specific Gravity, Urine: 1.02 (ref 1.005–1.030)
Urobilinogen, UA: 0.2 mg/dL (ref 0.0–1.0)

## 2012-11-27 NOTE — Patient Instructions (Signed)
Embarazo - Segundo trimestre (Pregnancy - Second Trimester) El segundo trimestre del embarazo (del 3 al 6 mes) es un perodo de evolucin rpida para usted y el beb. Hacia el final del sexto mes, el beb mide aproximadamente 23 cm y pesa 680 g. Comenzar a sentir los movimientos del beb entre las 18 y las 20 semanas de embarazo. Podr sentir las pataditas ("quickening en ingls"). Hay un rpido aumento de peso. Puede segregar un lquido claro (calostro) de las mamas. Quizs sienta pequeas contracciones en el vientre (tero) Esto se conoce como falso trabajo de parto o contracciones de Braxton-Hicks. Es como una prctica del trabajo de parto que se produce cuando el beb est listo para salir. Generalmente los problemas de vmitos matinales ya se han superado hacia el final del primer trimestre. Algunas mujeres desarrollan pequeas manchas oscuras (que se denominan cloasma, mscara del embarazo) en la cara que normalmente se van luego del nacimiento del beb. La exposicin al sol empeora las manchas. Puede desarrollarse acn en algunas mujeres embarazadas, y puede desaparecer en aquellas que ya tienen acn. EXAMENES PRENATALES  Durante los exmenes prenatales, deber seguir realizando pruebas de sangre, segn avance el embarazo. Estas pruebas se realizan para controlar su salud y la del beb. Tambin se realizan anlisis de sangre para conocer los niveles de hemoglobina. La anemia (bajo nivel de hemoglobina) es frecuente durante el embarazo. Para prevenirla, se administran hierro y vitaminas. Tambin se le realizarn exmenes para saber si tiene diabetes entre las 24 y las 28 semanas del embarazo. Podrn repetirle algunas de las pruebas que le hicieron previamente.  En cada visita le medirn el tamao del tero. Esto se realiza para asegurarse de que el beb est creciendo correctamente de acuerdo al estado del embarazo.  Tambin en cada visita prenatal controlarn su presin arterial. Esto se realiza  para asegurarse de que no tenga toxemia.  Se controlar su orina para asegurarse de que no tenga infecciones, diabetes o protena en la orina.  Se controlar su peso regularmente para asegurarse que el aumento ocurre al ritmo indicado. Esto se hace para asegurarse que usted y el beb tienen una evolucin normal.  En algunas ocasiones se realiza una prueba de ultrasonido para confirmar el correcto desarrollo y evolucin del beb. Esta prueba se realiza con ondas sonoras inofensivas para el beb, de modo que el profesional pueda calcular ms precisamente la fecha del parto. Algunas veces se realizan pruebas especializadas del lquido amnitico que rodea al beb. Esta prueba se denomina amniocentesis. El lquido amnitico se obtiene introduciendo una aguja en el vientre (abdomen). Se realiza para controlar los cromosomas en aquellos casos en los que existe alguna preocupacin acerca de algn problema gentico que pueda sufrir el beb. En ocasiones se lleva a cabo cerca del final del embarazo, si es necesario inducir al parto. En este caso se realiza para asegurarse que los pulmones del beb estn lo suficientemente maduros como para que pueda vivir fuera del tero. CAMBIOS QUE OCURREN EN EL SEGUNDO TRIMESTRE DEL EMBARAZO Su organismo atravesar numerosos cambios durante el embarazo. Estos pueden variar de una persona a otra. Converse con el profesional que la asiste acerca los cambios que usted note y que la preocupen.  Durante el segundo trimestre probablemente sienta un aumento del apetito. Es normal tener "antojos" de ciertas comidas. Esto vara de una persona a otra y de un embarazo a otro.  El abdomen inferior comenzar a abultarse.  Podr tener la necesidad de orinar con ms frecuencia debido a   que el tero y el beb presionan sobre la vejiga. Tambin es frecuente contraer ms infecciones urinarias durante el embarazo. Puede evitarlas bebiendo gran cantidad de lquidos y vaciando la vejiga antes y  despus de mantener relaciones sexuales.  Podrn aparecer las primeras estras en las caderas, abdomen y mamas. Estos son cambios normales del cuerpo durante el embarazo. No existen medicamentos ni ejercicios que puedan prevenir estos cambios.  Es posible que comience a desarrollar venas inflamadas y abultadas (varices) en las piernas. El uso de medias de descanso, elevar sus pies durante 15 minutos, 3 a 4 veces al da y limitar la sal en su dieta ayuda a aliviar el problema.  Podr sentir acidez gstrica a medida que el tero crece y presiona contra el estmago. Puede tomar anticidos, con la autorizacin de su mdico, para aliviar este problema. Tambin es til ingerir pequeas comidas 4 a 5 veces al da.  La constipacin puede tratarse con un laxante o agregando fibra a su dieta. Beber grandes cantidades de lquidos, comer vegetales, frutas y granos integrales es de gran ayuda.  Tambin es beneficioso practicar actividad fsica. Si ha sido una persona activa hasta el embarazo, podr continuar con la mayora de las actividades durante el mismo. Si ha sido menos activa, puede ser beneficioso que comience con un programa de ejercicios, como realizar caminatas.  Puede desarrollar hemorroides hacia el final del segundo trimestre. Tomar baos de asiento tibios y utilizar cremas recomendadas por el profesional que lo asiste sern de ayuda para los problemas de hemorroides.  Tambin podr sentir dolor de espalda durante este momento de su embarazo. Evite levantar objetos pesados, utilice zapatos de taco bajo y mantenga una buena postura para ayudar a reducir los problemas de espalda.  Algunas mujeres embarazadas desarrollan hormigueo y adormecimiento de la mano y los dedos debido a la hinchazn y compresin de los ligamentos de la mueca (sndrome del tnel carpiano). Esto desaparece una vez que el beb nace.  Como sus pechos se agrandan, necesitar un sujetador ms grande. Use un sostn de soporte,  cmodo y de algodn. No utilice un sostn para amamantar hasta el ltimo mes de embarazo si va a amamantar al beb.  Podr observar una lnea oscura desde el ombligo hacia la zona pbica denominada linea nigra.  Podr observar que sus mejillas se ponen coloradas debido al aumento de flujo sanguneo en la cara.  Podr desarrollar "araitas" en la cara, cuello y pecho. Esto desaparece una vez que el beb nace. INSTRUCCIONES PARA EL CUIDADO DOMICILIARIO  Es extremadamente importante que evite el cigarrillo, hierbas medicinales, alcohol y las drogas no prescriptas durante el embarazo. Estas sustancias qumicas afectan la formacin y el desarrollo del beb. Evite estas sustancias durante todo el embarazo para asegurar el nacimiento de un beb sano.  La mayor parte de los cuidados que se aconsejan son los mismos que los indicados para el primer trimestre del embarazo. Cumpla con las citas tal como se le indic. Siga las instrucciones del profesional que lo asiste con respecto al uso de los medicamentos, el ejercicio y la dieta.  Durante el embarazo debe obtener nutrientes para usted y para su beb. Consuma alimentos balanceados a intervalos regulares. Elija alimentos como carne, pescado, leche y otros productos lcteos descremados, vegetales, frutas, panes integrales y cereales. El profesional le informar cul es el aumento de peso ideal.  Las relaciones sexuales fsicas pueden continuarse hasta cerca del fin del embarazo si no existen otros problemas. Estos problemas pueden ser la prdida temprana (  prematura) de lquido amnitico de las membranas, sangrado vaginal, dolor abdominal u otros problemas mdicos o del embarazo.  Realice actividad fsica todos los das, si no tiene restricciones. Consulte con el profesional que la asiste si no sabe con certeza si determinados ejercicios son seguros. El mayor aumento de peso tiene lugar durante los ltimos 2 trimestres del embarazo. El ejercicio la ayudar  a:  Controlar su peso.  Ponerla en forma para el parto.  Ayudarla a perder peso luego de haber dado a luz.  Use un buen sostn o como los que se usan para hacer deportes para aliviar la sensibilidad de las mamas. Tambin puede serle til si lo usa mientras duerme. Si pierde calostro, podr utilizar apsitos en el sostn.  No utilice la baera con agua caliente, baos turcos y saunas durante el embarazo.  Utilice el cinturn de seguridad sin excepcin cuando conduzca. Este la proteger a usted y al beb en caso de accidente.  Evite comer carne cruda, queso crudo, y el contacto con los utensilios y desperdicios de los gatos. Estos elementos contienen grmenes que pueden causar defectos de nacimiento en el beb.  El segundo trimestre es un buen momento para visitar a su dentista y evaluar su salud dental si an no lo ha hecho. Es importante mantener los dientes limpios. Utilice un cepillo de dientes blando. Cepllese ms suavemente durante el embarazo.  Es ms fcil perder algo de orina durante el embarazo. Apretar y fortalecer los msculos de la pelvis la ayudar con este problema. Practique detener la miccin cuando est en el bao. Estos son los mismos msculos que necesita fortalecer. Son tambin los mismos msculos que utiliza cuando trata de evitar los gases. Puede practicar apretando estos msculos 10 veces, y repetir esto 3 veces por da aproximadamente. Una vez que conozca qu msculos debe apretar, no realice estos ejercicios durante la miccin. Puede favorecerle una infeccin si la orina vuelve hacia atrs.  Pida ayuda si tiene necesidades econmicas, de asesoramiento o nutricionales durante el embarazo. El profesional podr ayudarla con respecto a estas necesidades, o derivarla a otros especialistas.  La piel puede ponerse grasa. Si esto sucede, lvese la cara con un jabn suave, utilice un humectante no graso y maquillaje con base de aceite o crema. CONSUMO DE MEDICAMENTOS Y DROGAS  DURANTE EL EMBARAZO  Contine tomando las vitaminas apropiadas para esta etapa tal como se le indic. Las vitaminas deben contener un miligramo de cido flico y deben suplementarse con hierro. Guarde todas las vitaminas fuera del alcance de los nios. La ingestin de slo un par de vitaminas o tabletas que contengan hierro puede ocasionar la muerte en un beb o en un nio pequeo.  Evite el uso de medicamentos, inclusive los de venta libre y hierbas que no hayan sido prescriptos o indicados por el profesional que la asiste. Algunos medicamentos pueden causar problemas fsicos al beb. Utilice los medicamentos de venta libre o de prescripcin para el dolor, el malestar o la fiebre, segn se lo indique el profesional que lo asiste. No utilice aspirina.  El consumo de alcohol est relacionado con ciertos defectos de nacimiento. Esto incluye el sndrome de alcoholismo fetal. Debe evitar el consumo de alcohol en cualquiera de sus formas. El cigarrillo causa nacimientos prematuros y bebs de bajo peso. El uso de drogas recreativas est absolutamente prohibido. Son muy nocivas para el beb. Un beb que nace de una madre adicta, ser adicto al nacer. Ese beb tendr los mismos sntomas de abstinencia que un adulto.    Infrmele al profesional si consume alguna droga.  No consuma drogas ilegales. Pueden causarle mucho dao al beb. SOLICITE ATENCIN MDICA SI: Tiene preguntas o preocupaciones durante su embarazo. Es mejor que llame para consultar las dudas que esperar hasta su prxima visita prenatal. De esta forma se sentir ms tranquila.  SOLICITE ATENCIN MDICA DE INMEDIATO SI:  La temperatura oral se eleva sin motivo por encima de 102 F (38.9 C) o segn le indique el profesional que lo asiste.  Tiene una prdida de lquido por la vagina (canal de parto). Si sospecha una ruptura de las membranas, tmese la temperatura y llame al profesional para informarlo sobre esto.  Observa unas pequeas manchas,  una hemorragia vaginal o elimina cogulos. Notifique al profesional acerca de la cantidad y de cuntos apsitos est utilizando. Unas pequeas manchas de sangre son algo comn durante el embarazo, especialmente despus de mantener relaciones sexuales.  Presenta un olor desagradable en la secrecin vaginal y observa un cambio en el color, de transparente a blanco.  Contina con las nuseas y no obtiene alivio de los remedios indicados. Vomita sangre o algo similar a la borra del caf.  [place] o sube ms de 900 g. en una semana, o segn lo indicado por el profesional que la asiste.  Observa que se le hinchan el rostro, las manos, los pies o las piernas.  Ha estado expuesta a la rubola y no ha sufrido la enfermedad.  Ha estado expuesta a la quinta enfermedad o a la varicela.  Presenta dolor abdominal. Las molestias en el ligamento redondo son una causa no cancerosa (benigna) frecuente de dolor abdominal durante el embarazo. El profesional que la asiste deber evaluarla.  Presenta dolor de cabeza intenso que no se alivia.  Presenta fiebre, diarrea, dolor al orinar o le falta la respiracin.  Presenta dificultad para ver, visin borrosa, o visin doble.  Sufre una cada, un accidente de trnsito o cualquier tipo de trauma.  Vive en un hogar en el que existe violencia fsica o mental. Document Released: 02/10/2005 Document Revised: 01/26/2012 [employer] Patient Information 2014 [employer].  Lactancia materna  (Breastfeeding)  El cambio hormonal durante el embarazo produce el desarrollo del tejido mamario y un aumento en el nmero y tamao de los conductos galactforos. La hormona prolactina permite que las protenas, los azcares y las grasas de la sangre produzcan la leche materna en las glndulas productoras de leche. La hormona progesterona impide que la leche materna sea liberada antes del nacimiento del beb. Despus del nacimiento del beb, su nivel de progesterona disminuye  permitiendo que la leche materna sea liberada. Pensar en el beb, as como la succin o el llanto, pueden estimular la liberacin de leche de las glndulas productoras de leche.  La decisin de amamantar (lactar) es una de las mejores opciones que usted puede hacer para usted y su beb. La informacin que sigue da una breve resea de los beneficios, as como otras caractersticas importantes que debe saber sobre la lactancia materna.  LOS BENEFICIOS DE AMAMANTAR  Para el beb   La primera leche (calostro) ayuda al mejor funcionamiento del sistema digestivo del beb.   La leche tiene anticuerpos que provienen de la madre y que ayudan a prevenir las infecciones en el beb.   El beb tiene una menor incidencia de asma, alergias y del sndrome de muerte sbita del lactante (SMSL).   Los nutrientes de la leche materna son mejores para el beb que la leche maternizada.  La leche materna mejora   el desarrollo cerebral del beb.   Su beb tendr menos gases, clicos y estreimiento.  Es menos probable que el beb desarrolle otras enfermedades, como obesidad infantil, asma o diabetes mellitus. Para usted   La lactancia materna favorece el desarrollo de un vnculo muy especial entre la madre y el beb.   Es ms conveniente, siempre disponible, a la temperatura adecuada y econmica.   La lactancia materna ayuda a quemar caloras y a perder el peso ganado durante el embarazo.   Hace que el tero se contraiga ms rpidamente a su tamao normal y disminuye el sangrado despus del parto.   Las madres que amamantan tienen menos riesgo de desarrollar osteoporosis o cncer de mama o de ovario en el futuro.  FRECUENCIA DEL AMAMANTAMIENTO   Un beb sano, nacido a trmino, puede amamantarse con tanta frecuencia como cada hora, o espaciar las comidas cada tres horas. La frecuencia en la lactancia varan de un beb a otro.   Los recin nacidos deben ser alimentados por lo menos cada 2-3 horas  durante el da y cada 4-5 horas durante la noche. Usted debe amamantarlo un mnimo de 8 tomas en un perodo de 24 horas.  Despierte al beb para amamantarlo si han pasado 3-4 horas desde la ltima comida.  Amamante cuando sienta la necesidad de reducir la plenitud de sus senos o cuando el beb muestre signos de hambre. Las seales de que el beb puede tener hambre son:  Aumenta su estado de alerta o vigilancia.  Se estira.  Mueve la cabeza de un lado a otro.  Mueve la cabeza y abre la boca cuando se le toca la mejilla o la boca (reflejo de succin).  Aumenta las vocalizaciones, tales como sonidos de succin, relamerse los labios, arrullos, suspiros, o chirridos.  Mueve la mano hacia la boca.  Se chupa con ganas los dedos o las manos.  Agitacin.  Llanto intermitente.  Los signos de hambre extrema requerirn que lo calme y lo consuele antes de tratar de alimentarlo. Los signos de hambre extrema son:  Agitacin.  Llanto fuerte e intenso.  Gritos.  El amamantamiento frecuente la ayudar a producir ms leche y a prevenir problemas de dolor en los pezones e hinchazn de las mamas.  LACTANCIA MATERNA   Ya sea que se encuentre acostada o sentada, asegrese que el abdomen del beb est enfrente el suyo.   Sostenga la mama con el pulgar por arriba y los otros 4 dedos por debajo del pezn. Asegrese que sus dedos se encuentren lejos del pezn y de la boca del beb.   Empuje suavemente los labios del beb con el pezn o con el dedo.   Cuando la boca del beb se abra lo suficiente, introduzca el pezn y la zona oscura que lo rodea (areola) tanto como le sea posible dentro de la boca.  Debe haber ms areola visible por arriba del labio superior que por debajo del labio inferior.  La lengua del beb debe estar entre la enca inferior y el seno.  Asegrese de que la boca del beb est en la posicin correcta alrededor del pezn (prendida). Los labios del beb deben crear un sello  sobre su pecho.  Las seales de que el beb se ha prendido eficazmente al pezn son:  Tironea o succiona sin dolor.  Se escucha que traga entre las succiones.  No hace ruidos ni chasquidos.  Hay movimientos musculares por arriba y por delante de sus odos al succionar.  El beb debe   succionar unos 2-3 minutos para que salga la leche. Permita que el nio se alimente en cada mama todo lo que desee. Alimente al beb hasta que se desprenda o se quede dormido en el primer pecho y luego ofrzcale el segundo pecho.  Las seales de que el beb est lleno y satisfecho son:  Disminuye gradualmente el nmero de succiones o no succiona.  Se queda dormido.  Extiende o relaja su cuerpo.  Retiene una pequea cantidad de leche en la boca.  Se desprende del pecho por s mismo.  Los signos de una lactancia materna eficaz son:  Los senos han aumentado la firmeza, el peso y el tamao antes de la alimentacin.  Son ms blandos despus de amamantar.  Un aumento del volumen de leche, y tambin el cambio de su consistencia y color se producen hacia el quinto da de lactancia materna.  La congestin mamaria se alivia al dar de mamar.  Los pezones no duelen, ni estn agrietados ni sangran.  De ser necesario, interrumpa la succin poniendo su dedo en la esquina de la boca del beb y deslizando el dedo entre sus encas. A continuacin, retire la mama de su boca.  Es comn que los bebs regurgiten un poco despus de comer.  A menudo los bebs tragan aire al alimentarse. Esto puede hacer que se sienta molesto. Hacer eructar al beb al cambiar de pecho puede ser de ayuda.  Se recomiendan suplementos de vitamina D para los bebs que reciben slo leche materna.  Evite el uso del chupete durante las primeras 4 a 6 semanas de vida.  Evite la alimentacin suplementaria con agua, frmula o jugo en lugar de la leche materna. La leche materna es todo el alimento que el beb necesita. No es necesario que el  nio ingiera agua o preparados de bibern. Sus pechos producirn ms leche si se evita la alimentacin suplementaria durante las primeras semanas. COMO SABER SI EL BEB OBTIENE LA SUFICIENTE LECHE MATERNA  Preguntarse si el beb obtiene la cantidad suficiente de leche es una preocupacin frecuente entre las madres. Puede asegurarse que el beb tiene la leche suficiente si:   El beb succiona activamente y usted escucha que traga.   El beb parece estar relajado y satisfecho despus de mamar.   El nio se alimenta al menos 8 a 12 veces en 24 horas.  Durante los primeros 3 a 5 das de vida:  Moja 3-5 paales en 24 horas. La materia fecal debe ser blanda y amarillenta.  Tiene al menos 3 a 4 deposiciones en 24 horas. La materia fecal debe ser blanda y amarillenta.  A los 5-7 das de vida, el beb debe tener al menos 3-6 deposiciones en 24 horas. La materia fecal debe ser grumosa y amarilla a los 5 das de vida.  Su beb tiene una prdida de peso menor a 7al 10% durante los primeros 3 das de vida.  El beb no pierde peso despus de 3-7 das de vida.  El beb debe aumentar 4 a 6 libras (120 a 170 gr.) por semana despus de los 4 das de vida.  Aumenta de peso a los 5 das de vida y vuelve al peso del nacimiento dentro de las 2 semanas. CONGESTIN MAMARIA  Durante la primera semana despus del parto, usted puede experimentar hinchazn en las mamas (congestin mamaria). Al estar congestionadas, las mamas se sienten pesadas, calientes o sensibles al tacto. El pico de la congestin ocurre a las 24 -48 horas despus del parto.     La congestin puede disminuirse:  Continuando con la lactancia materna.  Aumentando la frecuencia.  Tomando duchas calientes o aplicando calor hmedo en los senos antes de cada comida. Esto aumenta la circulacin y ayuda a que la leche fluya.   Masajeando suavemente el pecho antes y durante las comidas. Con las yemas de los dedos, masajee la pared del pecho hacia  el pezn en un movimiento circular.   Asegurarse de que el beb vaca al menos uno de sus pechos en cada alimentacin. Tambin ayuda si comienza la siguiente toma en el otro seno.   Extraiga manualmente o con un sacaleches las mamas para vaciar los pechos si el beb tiene sueo o no se aliment bien. Tambin puede extraer la leche cuando vuelva a trabajar o si siente que se estn congestionando las mamas.  Asegrese de que el beb se prende y est bien colocado durante la lactancia. Si sigue estas indicaciones, la congestin debe mejorar en 24 a 48 horas. Si an tiene dificultades, consulte a su asesor en lactancia.  CUDESE USTED MISMA  Cuide sus mamas.   Bese o dchese diariamente.   Evite usar jabn en los pezones.   Use un sostn de soporte Evite el uso de sostenes con aro.  Seque al aire sus pezones durante 3-4 minutos despus de cada comida.   Utilice slo apsitos de algodn en el sostn para absorber las prdidas de leche. La prdida de un poco de leche materna entre las comidas es normal.   Use solamente lanolina pura en sus pezones despus de amamantar. Usted no tiene que lavarla antes de alimentar al beb. Otra opcin es sacarse unas gotas de leche y masajear suavemente los pezones.  Continuar con los autocontroles de la mama. Cudese.   Consuma alimentos saludables. Alterne 3 comidas con 3 colaciones.  Evite los alimentos que usted nota que perjudican al beb.  Beba leche, jugos de fruta y agua para satisfacer su sed (aproximadamente 8 vasos al da).   Descanse con frecuencia, reljese y tome sus vitaminas prenatales para evitar la fatiga, el estrs y la anemia.  Evite masticar y fumar tabaco.  Evite el consumo de alcohol y drogas.  Tome medicamentos de venta libre y recetados tal como le indic su mdico o farmacutico. Siempre debe consultar con su mdico o farmacutico antes de tomar cualquier medicamento, vitamina o suplemento de hierbas.  Sepa que  durante la lactancia puede quedar embarazada. Si lo desea, hable con su mdico acerca de la planificacin familiar y los mtodos anticonceptivos seguros que puede utilizar durante la lactancia. SOLICITE ATENCIN MDICA SI:   Usted siente que quiere dejar de amamantar o se siente frustrada con la lactancia.  Siente dolor en los senos o en los pezones.  Sus pezones estn agrietados o sangran.  Sus pechos estn irritados, sensibles o calientes.  Tiene un rea hinchada en cualquiera de los senos.  Siente escalofros o fiebre.  Tiene nuseas o vmitos.  Observa un drenaje en los pezones.  Sus mamas no se llenan antes de amamantarlo al 5to da despus del parto.  Se siente triste y deprimida.  El nio est demasiado somnoliento como para comer.  El nio tiene problemas para respirar.   Moja menos de 3 paales en 24 horas.  Mueve el intestino menos de 3 veces en 24 horas.  La piel del beb o la parte blanca de sus ojos est ms amarilla.   El beb no ha aumentado de peso a los 5 das de vida.   ASEGRESE DE QUE:   Comprende estas instrucciones.  Controlar su enfermedad.  Solicitar ayuda de inmediato si no mejora o si empeora. Document Released: 05/03/2005 Document Revised: 01/26/2012 [employer] Patient Information 2014 [employer].  

## 2012-11-27 NOTE — Progress Notes (Signed)
Ultrasound scheduled with MFM for 12/06/12 at 10 am

## 2012-11-27 NOTE — Progress Notes (Signed)
Needs anatomy--nml quad screen. BP good today

## 2012-11-27 NOTE — Progress Notes (Signed)
Pulse:109 

## 2012-12-06 ENCOUNTER — Ambulatory Visit (HOSPITAL_COMMUNITY)
Admission: RE | Admit: 2012-12-06 | Discharge: 2012-12-06 | Disposition: A | Discharge status: Home / Self Care | Payer: Self-pay | Source: Ambulatory Visit | Attending: Family Medicine | Admitting: Family Medicine

## 2012-12-06 VITALS — BP 139/82 | HR 97 | Wt 213.5 lb

## 2012-12-06 DIAGNOSIS — O10019 Pre-existing essential hypertension complicating pregnancy, unspecified trimester: Secondary | ICD-10-CM | POA: Insufficient documentation

## 2012-12-06 DIAGNOSIS — E669 Obesity, unspecified: Secondary | ICD-10-CM | POA: Insufficient documentation

## 2012-12-06 DIAGNOSIS — Z3689 Encounter for other specified antenatal screening: Secondary | ICD-10-CM | POA: Insufficient documentation

## 2012-12-06 DIAGNOSIS — O10012 Pre-existing essential hypertension complicating pregnancy, second trimester: Secondary | ICD-10-CM

## 2012-12-06 NOTE — Progress Notes (Addendum)
[facility] ultrasound  Indication: 32 yr old G2P1001 at [redacted]w[redacted]d by 16 week ultrasound with chronic hypertension and obesity for fetal anatomic survey.  Findings: 1. Single intrauterine pregnancy. 2. Fetal biometry is consistent with dating. 3. Posterior placenta without evidence of previa. 4. Normal amniotic fluid volume. 5. Normal transabdominal cervical length. 6. The views of the cavum, choroid, palate, heart, and ankles are limited. 7. The remainder of the limited anatomy survey is normal.  Recommendations: 1. Appropriate fetal growth. 2. Limited anatomy survey: - recommend follow up in 3 weeks to complete anatomic survey 3. Chronic hypertension: - no consult requested - recommend close surveillance for the development of signs/symptoms of preeclampsia - recommend fetal growth every 4 weeks - recommend antenatal testing starting at [redacted] weeks gestation - recommend delivery by estimated due date but not prior to 38 weeks in the absence of other complications 4. Normal quad screen: - calculated based on EDD 8 days different from actual EDD; however given <10 days different do not recommend recalculate  Eulis Foster, MD

## 2012-12-07 ENCOUNTER — Other Ambulatory Visit: Payer: Self-pay

## 2012-12-07 ENCOUNTER — Inpatient Hospital Stay (HOSPITAL_COMMUNITY)
Admission: AD | Admit: 2012-12-07 | Discharge: 2012-12-07 | Disposition: A | Discharge status: Home / Self Care | Payer: Self-pay | Source: Ambulatory Visit | Attending: Obstetrics & Gynecology | Admitting: Obstetrics & Gynecology

## 2012-12-07 ENCOUNTER — Encounter (HOSPITAL_COMMUNITY): Payer: Self-pay

## 2012-12-07 ENCOUNTER — Encounter: Payer: Self-pay | Admitting: Family Medicine

## 2012-12-07 DIAGNOSIS — R0789 Other chest pain: Secondary | ICD-10-CM

## 2012-12-07 DIAGNOSIS — O10019 Pre-existing essential hypertension complicating pregnancy, unspecified trimester: Secondary | ICD-10-CM | POA: Insufficient documentation

## 2012-12-07 DIAGNOSIS — K219 Gastro-esophageal reflux disease without esophagitis: Secondary | ICD-10-CM

## 2012-12-07 DIAGNOSIS — O99891 Other specified diseases and conditions complicating pregnancy: Secondary | ICD-10-CM | POA: Insufficient documentation

## 2012-12-07 DIAGNOSIS — O99810 Abnormal glucose complicating pregnancy: Secondary | ICD-10-CM | POA: Insufficient documentation

## 2012-12-07 LAB — COMPREHENSIVE METABOLIC PANEL
BUN: 6 mg/dL (ref 6–23)
CO2: 23 mEq/L (ref 19–32)
Chloride: 100 mEq/L (ref 96–112)
Creatinine, Ser: 0.38 mg/dL — ABNORMAL LOW (ref 0.50–1.10)
GFR calc non Af Amer: >90 mL/min (ref >90)
Total Bilirubin: 0.1 mg/dL — ABNORMAL LOW (ref 0.3–1.2)

## 2012-12-07 LAB — CBC
Hemoglobin: 11.8 g/dL — ABNORMAL LOW (ref 12.0–15.0)
MCH: 30 pg (ref 26.0–34.0)
MCV: 85.8 fL (ref 78.0–100.0)
RBC: 3.93 MIL/uL (ref 3.87–5.11)

## 2012-12-07 MED ORDER — LACTATED RINGERS IV SOLN: INTRAVENOUS | Status: DC

## 2012-12-07 MED ORDER — SODIUM CHLORIDE 0.9 % IV SOLN
INTRAVENOUS | Status: DC
  Administered 2012-12-07: 08:00:00 via INTRAVENOUS

## 2012-12-07 MED ORDER — GI COCKTAIL ~~LOC~~
30.0000 mL | Freq: Three times a day (TID) | ORAL | Status: DC | PRN
  Administered 2012-12-07: 30 mL via ORAL
  Filled 2012-12-07: qty 30

## 2012-12-07 MED ORDER — MORPHINE SULFATE 4 MG/ML IJ SOLN
4.0000 mg | Freq: Once | INTRAMUSCULAR | Status: AC
  Administered 2012-12-07: 4 mg via INTRAVENOUS
  Filled 2012-12-07: qty 1

## 2012-12-07 MED ORDER — CYCLOBENZAPRINE HCL 10 MG PO TABS: 10.0000 mg | ORAL_TABLET | Freq: Three times a day (TID) | ORAL | Status: DC | PRN

## 2012-12-07 MED ORDER — RANITIDINE HCL 150 MG PO TABS: 150.0000 mg | ORAL_TABLET | Freq: Two times a day (BID) | ORAL | Status: DC

## 2012-12-07 NOTE — MAU Provider Note (Addendum)
Chief Complaint: No chief complaint on file.   First Provider Initiated Contact with Patient 12/07/12 0750.     SUBJECTIVE HPI: Kari Thomas is a 32 y.o. G2P1001 at [redacted]w[redacted]d by LMP who presents with left chest pain radiating down left arm since 0300. Rates pain 9/10.   Past Medical History  Diagnosis Date  . Hypertension   . Gestational diabetes 2014   OB History   Grav Para Term Preterm Abortions TAB SAB Ect Mult Living   2 1 1       1      # Outc Date GA Lbr Len/2nd Wgt Sex Del Anes PTL Lv   1 TRM 9/06 [redacted]w[redacted]d 13:00 3.062kg(6lb12oz) M SVD EPI  Yes   Comments: chest pain; pre-eclampsia   2 CUR              No past surgical history on file. History   Social History  . Marital Status: Married    Spouse Name: N/A    Number of Children: N/A  . Years of Education: N/A   Occupational History  . Not on file.   Social History Main Topics  . Smoking status: Never Smoker   . Smokeless tobacco: Never Used  . Alcohol Use: No  . Drug Use: No  . Sexually Active: Yes   Other Topics Concern  . Not on file   Social History Narrative  . No narrative on file   No current facility-administered medications on file prior to encounter.   Current Outpatient Prescriptions on File Prior to Encounter  Medication Sig Dispense Refill  . Prenatal Vit-Fe Fumarate-FA (PRENATAL VITAMINS PLUS) 27-1 MG TABS Take 1 tablet by mouth daily.       No Known Allergies  ROS: Pos for chest pain, left arm pain, low abd pain (resolved), vaginal discharge. Neg for SOB, palpitations. VB, fever, chills.   OBJECTIVE Blood pressure 132/63, pulse 108, temperature 98 F (36.7 C), temperature source Oral, resp. rate 20, last menstrual period 07/13/2012, SpO2 100.00%. GENERAL: Well-developed, well-nourished female in moderate distress.  HEENT: Normocephalic HEART: normal rate and rhythm. No M/R/G RESP: normal effort ABDOMEN: Soft, non-tender. Gravid. S=D EXTREMITIES: Nontender, no edema.   Upper  extremities:  NT  Normal mvmt.   Grossly normal sensation.   Strength normal and equal bilat.  NEURO: Alert and oriented  MAU COURSE Ordered EKG, IV, Troponin, CBC, CMET, Morphine, GI Cocktail.  Care of pt turned over to Sharen Counter, CNM at [phone].  Dorathy Kinsman, CNM 12/07/2012  8:02 AM    Results for orders placed during the hospital encounter of 12/07/12 (from the past 24 hour(s))  CBC     Status: Abnormal   Collection Time    12/07/12  8:15 AM      Result Value Range   WBC 10.3  4.0 - 10.5 K/uL   RBC 3.93  3.87 - 5.11 MIL/uL   Hemoglobin 11.8 (*) 12.0 - 15.0 g/dL   HCT 98.1 (*) 19.1 - 47.8 %   MCV 85.8  78.0 - 100.0 fL   MCH 30.0  26.0 - 34.0 pg   MCHC 35.0  30.0 - 36.0 g/dL   RDW 29.5  62.1 - 30.8 %   Platelets 240  150 - 400 K/uL  TROPONIN I     Status: None   Collection Time    12/07/12  8:15 AM      Result Value Range   Troponin I <0.30  <0.30 ng/mL  COMPREHENSIVE METABOLIC PANEL  Status: Abnormal   Collection Time    12/07/12  8:15 AM      Result Value Range   Sodium 133 (*) 135 - 145 mEq/L   Potassium 3.5  3.5 - 5.1 mEq/L   Chloride 100  96 - 112 mEq/L   CO2 23  19 - 32 mEq/L   Glucose, Bld 106 (*) 70 - 99 mg/dL   BUN 6  6 - 23 mg/dL   Creatinine, Ser 1.61 (*) 0.50 - 1.10 mg/dL   Calcium 9.1  8.4 - 09.6 mg/dL   Total Protein 6.9  6.0 - 8.3 g/dL   Albumin 3.3 (*) 3.5 - 5.2 g/dL   AST 13  0 - 37 U/L   ALT 11  0 - 35 U/L   Alkaline Phosphatase 39  39 - 117 U/L   Total Bilirubin 0.1 (*) 0.3 - 1.2 mg/dL   GFR calc non Af Amer >90  >90 mL/min   GFR calc Af Amer >90  >90 mL/min   Physical exam: Tenderness to palpation of left arm, especially tricep muscle, and tenderness to palpation of left chest wall.  A: 1. Musculoskeletal chest pain   2. Acid reflux     P: D/C home Rest, ice, heat, tylenol, Flexeril prescribed for musculoskeletal pain Zantac BID, discussed diet changes for acid reflux Spanish interpreter used for all  communication F/U in WOC Return to MAU as needed  Sharen Counter Certified Nurse-Midwife

## 2012-12-07 NOTE — MAU Provider Note (Signed)
Attestation of Attending Supervision of Advanced Practitioner (PA/CNM/NP): Evaluation and management procedures were performed by the Advanced Practitioner under my supervision and collaboration.  I have reviewed the Advanced Practitioner's note and chart, and I agree with the management and plan.  Armonee Bojanowski, MD, FACOG Attending Obstetrician & Gynecologist Faculty Practice, [facility]  

## 2012-12-07 NOTE — MAU Note (Signed)
Hx of CHTN per CNM.  Pt denies any meds for same.   Had an ECG with her first preg at 8mon due to really bad chest pain, no cause- pt states high blood pressure.

## 2012-12-07 NOTE — MAU Note (Signed)
Pain makes her feel nauseous.   Cool wet cloth to forehead.  Pt hot and anxious.  Annice Pih here doing ECG

## 2012-12-07 NOTE — MAU Note (Signed)
While starting IV, husband stated pt has been burping a lot lately, and had thrown up this morning.

## 2012-12-07 NOTE — MAU Provider Note (Signed)
Attestation of Attending Supervision of Advanced Practitioner (PA/CNM/NP): Evaluation and management procedures were performed by the Advanced Practitioner under my supervision and collaboration.  I have reviewed the Advanced Practitioner's note and chart, and I agree with the management and plan.  Lulie Hurd, MD, FACOG Attending Obstetrician & Gynecologist Faculty Practice, [facility]  

## 2012-12-07 NOTE — MAU Note (Signed)
Patient states she started having pain in her left chest and left arm that is constant and hurts bad. Patient has no history of cardiac problems, blood clots

## 2012-12-18 ENCOUNTER — Ambulatory Visit (INDEPENDENT_AMBULATORY_CARE_PROVIDER_SITE_OTHER): Payer: Self-pay | Admitting: Obstetrics & Gynecology

## 2012-12-18 VITALS — BP 146/93 | Temp 97.5°F | Wt 212.5 lb

## 2012-12-18 DIAGNOSIS — O0992 Supervision of high risk pregnancy, unspecified, second trimester: Secondary | ICD-10-CM

## 2012-12-18 DIAGNOSIS — O10019 Pre-existing essential hypertension complicating pregnancy, unspecified trimester: Secondary | ICD-10-CM

## 2012-12-18 LAB — POCT URINALYSIS DIP (DEVICE)
Bilirubin Urine: NEGATIVE
Glucose, UA: NEGATIVE mg/dL
Leukocytes, UA: NEGATIVE
Nitrite: NEGATIVE

## 2012-12-18 MED ORDER — OMEPRAZOLE 40 MG PO CPDR: 40.0000 mg | DELAYED_RELEASE_CAPSULE | Freq: Every day | ORAL | Status: DC

## 2012-12-18 NOTE — Progress Notes (Signed)
Pulse- 99  Edema-feet Pt went to MAU on 12/07/12 for chest pain- elevated BP

## 2012-12-18 NOTE — Progress Notes (Signed)
Treated for acid reflux, still has nausea, will change form Zantac to Prilosec

## 2012-12-18 NOTE — Patient Instructions (Signed)
Acidez de estmago durante el embarazo  (Heartburn During Pregnancy)  La acidez es la sensacin de ardor en el pecho que se siente cuando el cido del estmago vuelve haca el esfago. La acidez (tambin llamada "reflujo") es frecuente en el embarazo debido a ciertos cambios hormonales (progesterona). La progesterona relaja la vlvula que separa el esfago del [place]. Esto hace que el cido suba al esfago y cause acidez. Tambin puede ocurrir Visual merchandiser debido a que el tero al agrandarse empuja el estmago, lo que hace que suba ms cido al esfago. Esto se produce especialmente en las ltimas etapas del embarazo. La acidez generalmente desaparece despus del parto.  CAUSAS  La hormona progesterona.  Cambios en los niveles hormonales.  El tero crece y [address] cido del estmago [place].  Comidas abundantes.  Ciertos alimentos y bebidas.  La prctica de ejercicios.  Aumento en la produccin de cido. SNTOMAS  Dolor intenso en el pecho o la parte baja de la garganta.  Gusto amargo en la boca.  Tos. DIAGNSTICO El mdico la diagnostica con una historia clnica cuidadosa en la que pregunta por sus molestias. Le indicar un anlisis de sangre para Clinical research associate cierto tipo de bacteria que se asocia con la [place]. En algunos casos se diagnostica recetando un medicamento para calmar la acidez y viendo si los sntomas mejoran. Durante el embarazo no es frecuente que se indique una endoscopa. En este procedimiento se [place] un tubo con [place] luz y una cmara en un extremo, y se examina el esfago y Investment banker, corporate.  TRATAMIENTO  El mdico aconsejar sobre el uso de medicamentos de venta libre (anticidos, medicamentos para disminuir la Engineering geologist) en los casos de sntomas leves.  El mdico indicar medicamentos para disminuir el cido estomacal o para proteger la superficie del [place].  El profesional indicar cambios en la dieta.  En casos graves, el mdico recomendar que eleve la  cabecera de la cama con bloques. (Dormir con ms almohadas no es Manufacturing systems engineer, ya que slo modifica la posicin de la cabeza y no mejora el problema principal del reflujo cido del estmago al esfago.) INSTRUCCIONES PARA EL CUIDADO DOMICILIARIO  Tome todos los medicamentos segn le indic su mdico.  Levante la cabecera de la cama con bloques bajo las patas.  No haga ejercicios enseguida despus de comer.  Evite comer 2  3 horas antes de ir a dormir.  No se acueste enseguida despus de comer.  Haga comidas pequeas durante Glass blower/designer de 3 comidas abundantes.  Identifique los alimentos o las bebidas que empeoran sus sntomas y evtelos. Los alimentos que debe evitar son:  Pimientos.  Chocolate.  Alimentos alto en grasas, incluidos los fritos.  Comidas muy condimentadas.  Ajo, cebolla.  Frutos ctricos, que incluyen naranjas, uvas, limones y limas.  Alimentos que contengan tomates o productos derivados del [place].  Menta.  Bebidas gaseosas y con cafena.  [place]inagre. SOLICITE ATENCIN MDICA DE INMEDIATO SI:  Tiene dolor intenso en el pecho que baja hacia el brazo o hacia la mandbula o cuello.  Se siente sudoroso, mareado o sufre un desmayo.  Falta de aire.  Vomita sangre.  Dolor o dificultad para tragar.  Materia fecal con sangre o de color negro.  Tiene acidez ms de 3 veces por semana durante ms de 2 semanas. ASEGRESE DE QUE:   Comprende estas instrucciones.  Controlar su enfermedad.  Solicitar ayuda de inmediato si no mejora o si empeora. Document Released: 02/10/2005 Document Revised: 07/26/2011  ExitCare Patient Information 2014 [employer].  

## 2012-12-19 ENCOUNTER — Encounter: Payer: Self-pay | Admitting: *Deleted

## 2012-12-20 ENCOUNTER — Ambulatory Visit (HOSPITAL_COMMUNITY)
Admission: RE | Admit: 2012-12-20 | Discharge: 2012-12-20 | Disposition: A | Discharge status: Home / Self Care | Payer: Self-pay | Source: Ambulatory Visit | Attending: Obstetrics & Gynecology | Admitting: Obstetrics & Gynecology

## 2012-12-20 VITALS — BP 137/84 | HR 104 | Wt 217.0 lb

## 2012-12-20 DIAGNOSIS — O10019 Pre-existing essential hypertension complicating pregnancy, unspecified trimester: Secondary | ICD-10-CM | POA: Insufficient documentation

## 2012-12-20 DIAGNOSIS — Z3689 Encounter for other specified antenatal screening: Secondary | ICD-10-CM

## 2012-12-20 DIAGNOSIS — O10012 Pre-existing essential hypertension complicating pregnancy, second trimester: Secondary | ICD-10-CM

## 2012-12-20 DIAGNOSIS — E669 Obesity, unspecified: Secondary | ICD-10-CM | POA: Insufficient documentation

## 2012-12-20 NOTE — Progress Notes (Signed)
Kari Thomas  was seen today for an ultrasound appointment.  See full report in [facility].  Impression: Single IUP at 24 0/7 weeks Normal interval anatomy - somewhat limited views of the fetal heart obtained (outflow tracts, ductal arch) due to fetal position Fetal growth is appropriate (46th %tile) Normal amniotic fluid volume  Recommendations: Recommend follow-up ultrasound examination in 4 weeks for growth and to reevaluate the fetal heart.  Alpha Gula, MD

## 2013-01-01 ENCOUNTER — Ambulatory Visit (INDEPENDENT_AMBULATORY_CARE_PROVIDER_SITE_OTHER): Payer: Self-pay | Admitting: Family

## 2013-01-01 VITALS — BP 120/81 | Temp 96.8°F | Wt 216.4 lb

## 2013-01-01 DIAGNOSIS — O10913 Unspecified pre-existing hypertension complicating pregnancy, third trimester: Secondary | ICD-10-CM

## 2013-01-01 DIAGNOSIS — O10019 Pre-existing essential hypertension complicating pregnancy, unspecified trimester: Secondary | ICD-10-CM

## 2013-01-01 LAB — POCT URINALYSIS DIP (DEVICE)
Glucose, UA: NEGATIVE mg/dL
Protein, ur: NEGATIVE mg/dL
Specific Gravity, Urine: 1.02 (ref 1.005–1.030)
Urobilinogen, UA: 0.2 mg/dL (ref 0.0–1.0)

## 2013-01-01 NOTE — Progress Notes (Signed)
Intermittent sharp abd pain, max 1-2x/day; no bleeding or leaking of fluid.  Reviewed ultrasound results 8/6 (46%) > follow-up scheduled for 01/17/13.  Social Worker today per pt request.

## 2013-01-01 NOTE — Progress Notes (Signed)
Pulse- 104 Patient reports some abdominal pain; states sometimes she does not feel the baby move, but is today.

## 2013-01-17 ENCOUNTER — Encounter (HOSPITAL_COMMUNITY): Payer: Self-pay

## 2013-01-17 ENCOUNTER — Ambulatory Visit (HOSPITAL_COMMUNITY)
Admission: RE | Admit: 2013-01-17 | Discharge: 2013-01-17 | Disposition: A | Discharge status: Home / Self Care | Payer: Self-pay | Source: Ambulatory Visit | Attending: Obstetrics and Gynecology | Admitting: Obstetrics and Gynecology

## 2013-01-17 VITALS — BP 153/101 | HR 106 | Wt 220.0 lb

## 2013-01-17 DIAGNOSIS — O10019 Pre-existing essential hypertension complicating pregnancy, unspecified trimester: Secondary | ICD-10-CM | POA: Insufficient documentation

## 2013-01-17 DIAGNOSIS — E669 Obesity, unspecified: Secondary | ICD-10-CM | POA: Insufficient documentation

## 2013-01-17 DIAGNOSIS — O10013 Pre-existing essential hypertension complicating pregnancy, third trimester: Secondary | ICD-10-CM

## 2013-01-17 DIAGNOSIS — O10012 Pre-existing essential hypertension complicating pregnancy, second trimester: Secondary | ICD-10-CM

## 2013-01-17 NOTE — Progress Notes (Signed)
Kari Thomas  was seen today for an ultrasound appointment.  See full report in [facility].  Impression: Single IUP at 28 0/7 weeks Mild / borderline renal pylectasis noted (4-5 mm bilaterally) Interval anatomy is otherwise within normal limits- fetal survey now complete Fetal growth is appropriate (41st %tile) Subjectively increased amniotic fluid volume (AFI = 24 cm)  BPs: 153/101; repeat 120/74  Recommendations: Recommend follow-up ultrasound examination in 4 weeks - will reevaluate fetal kidneys at that time.  Alpha Gula, MD

## 2013-01-22 ENCOUNTER — Ambulatory Visit (INDEPENDENT_AMBULATORY_CARE_PROVIDER_SITE_OTHER): Payer: Self-pay | Admitting: Obstetrics and Gynecology

## 2013-01-22 VITALS — BP 134/87 | Temp 97.1°F | Wt 217.0 lb

## 2013-01-22 DIAGNOSIS — E669 Obesity, unspecified: Secondary | ICD-10-CM

## 2013-01-22 DIAGNOSIS — O10019 Pre-existing essential hypertension complicating pregnancy, unspecified trimester: Secondary | ICD-10-CM

## 2013-01-22 DIAGNOSIS — O0992 Supervision of high risk pregnancy, unspecified, second trimester: Secondary | ICD-10-CM

## 2013-01-22 LAB — CBC
HCT: 34.9 % — ABNORMAL LOW (ref 36.0–46.0)
Hemoglobin: 11.7 g/dL — ABNORMAL LOW (ref 12.0–15.0)
MCV: 86.8 fL (ref 78.0–100.0)
RDW: 13.3 % (ref 11.5–15.5)
WBC: 7.4 10*3/uL (ref 4.0–10.5)

## 2013-01-22 LAB — POCT URINALYSIS DIP (DEVICE)
Bilirubin Urine: NEGATIVE
Hgb urine dipstick: NEGATIVE
Nitrite: NEGATIVE
pH: 8.5 — ABNORMAL HIGH (ref 5.0–8.0)

## 2013-01-22 NOTE — Progress Notes (Signed)
Reviewed BPs. Did not get glucola>will order. [place] normal 41st %ile growth at 28 wks except mild borderline bilat fetal pyelectasis> f/u US 1 mo. No abd pain except occ nonpainful B-H. No concerns.

## 2013-01-22 NOTE — Progress Notes (Signed)
Pulse- 98 

## 2013-02-05 ENCOUNTER — Ambulatory Visit (INDEPENDENT_AMBULATORY_CARE_PROVIDER_SITE_OTHER): Payer: Self-pay | Admitting: Advanced Practice Midwife

## 2013-02-05 VITALS — BP 134/86 | Temp 97.0°F | Wt 219.0 lb

## 2013-02-05 DIAGNOSIS — O0992 Supervision of high risk pregnancy, unspecified, second trimester: Secondary | ICD-10-CM

## 2013-02-05 DIAGNOSIS — E669 Obesity, unspecified: Secondary | ICD-10-CM

## 2013-02-05 DIAGNOSIS — O10019 Pre-existing essential hypertension complicating pregnancy, unspecified trimester: Secondary | ICD-10-CM

## 2013-02-05 LAB — POCT URINALYSIS DIP (DEVICE)
Bilirubin Urine: NEGATIVE
Nitrite: NEGATIVE
Specific Gravity, Urine: 1.02 (ref 1.005–1.030)
pH: 7 (ref 5.0–8.0)

## 2013-02-05 NOTE — Progress Notes (Signed)
Doing well.  Good fetal movement, denies vaginal bleeding, LOF, cramping/contractions.  Twice weekly testing for [facility].  Discussed recent U/S with mild renal pyelectasis with pt.  U/S scheduled Oct 2, will reevaluate kidneys.

## 2013-02-05 NOTE — Progress Notes (Signed)
Pulse- 100  Edema-hands/feet

## 2013-02-15 ENCOUNTER — Ambulatory Visit (HOSPITAL_COMMUNITY)
Admission: RE | Admit: 2013-02-15 | Discharge: 2013-02-15 | Disposition: A | Discharge status: Home / Self Care | Payer: Self-pay | Source: Ambulatory Visit | Attending: Family Medicine | Admitting: Family Medicine

## 2013-02-15 ENCOUNTER — Other Ambulatory Visit (HOSPITAL_COMMUNITY): Payer: Self-pay | Admitting: Maternal and Fetal Medicine

## 2013-02-15 DIAGNOSIS — E669 Obesity, unspecified: Secondary | ICD-10-CM | POA: Insufficient documentation

## 2013-02-15 DIAGNOSIS — O10013 Pre-existing essential hypertension complicating pregnancy, third trimester: Secondary | ICD-10-CM

## 2013-02-15 DIAGNOSIS — O10019 Pre-existing essential hypertension complicating pregnancy, unspecified trimester: Secondary | ICD-10-CM | POA: Insufficient documentation

## 2013-02-19 ENCOUNTER — Ambulatory Visit (INDEPENDENT_AMBULATORY_CARE_PROVIDER_SITE_OTHER): Payer: Self-pay | Admitting: Family Medicine

## 2013-02-19 ENCOUNTER — Encounter: Payer: Self-pay | Admitting: Family Medicine

## 2013-02-19 VITALS — BP 123/80 | Temp 97.1°F | Wt 219.7 lb

## 2013-02-19 DIAGNOSIS — Z23 Encounter for immunization: Secondary | ICD-10-CM

## 2013-02-19 DIAGNOSIS — O10013 Pre-existing essential hypertension complicating pregnancy, third trimester: Secondary | ICD-10-CM

## 2013-02-19 DIAGNOSIS — O10019 Pre-existing essential hypertension complicating pregnancy, unspecified trimester: Secondary | ICD-10-CM

## 2013-02-19 LAB — POCT URINALYSIS DIP (DEVICE)
Ketones, ur: NEGATIVE mg/dL
Protein, ur: NEGATIVE mg/dL
Specific Gravity, Urine: 1.01 (ref 1.005–1.030)
pH: 5.5 (ref 5.0–8.0)

## 2013-02-19 LAB — FETAL NONSTRESS TEST

## 2013-02-19 NOTE — Patient Instructions (Signed)
Embarazo  Tercer trimestre  (Pregnancy - Third Trimester) El tercer trimestre del embarazo (los ltimos 3 meses) es el perodo en el cual tanto usted como su beb crecen con ms rapidez. El beb alcanza un largo de aproximadamente 50 cm. y pesa entre 2,700 y 4,500 kg. El beb gana ms tejido graso y est listo para la vida fuera del cuerpo de la madre. Mientras estn en el interior, los bebs tienen perodos de sueo y vigilia, succionan el pulgar y tienen hipo. Quizs sienta pequeas contracciones del tero. Este es el falso trabajo de parto. Tambin se las conoce como contracciones de Braxton-Hicks . Es como una prctica del parto. Los problemas ms habituales de esta etapa del embarazo incluyen mayor dificultad para respirar, hinchazn de las manos y los pies por retencin de lquidos y la necesidad de orinar con ms frecuencia debido a que el tero y el beb presionan sobre la vejiga.  EXAMENES PRENATALES   Durante los exmenes prenatales, deber seguir realizndose anlisis de sangre. Estas pruebas se realizan para controlar su salud y la del beb. Los anlisis de sangre se realizan para conocer los niveles de algunos compuestos de la sangre (hemoglobina). La anemia (bajo nivel de hemoglobina) es frecuente durante el embarazo. Para prevenirla, se administran hierro y vitaminas. Tambin le tomarn nuevas anlisis para descartar diabetes. Podrn repetirle algunas de las pruebas que le hicieron previamente.  En cada visita le medirn el tamao del tero. Esto permite asegurar que el beb se desarrolla adecuadamente, segn la fecha del embarazo.  Le controlarn la presin arterial en cada visita prenatal. Esto es para asegurarse de que no sufre toxemia.  Le harn un anlisis de orina en cada visita prenatal, para descartar infecciones, diabetes y la presencia de protenas.  Tambin en cada visita controlarn su peso. Esto se realiza para asegurarse que aumenta de peso al ritmo indicado y que usted y  su beb evolucionan normalmente.  En algunas ocasiones se realiza una prueba de ultrasonido para confirmar el correcto desarrollo y evolucin del beb. Esta prueba se realiza con ondas sonoras inofensivas para el beb, de modo que el profesional pueda calcular ms precisamente la fecha del parto.  Analice con su mdico los analgsicos y la anestesia que recibir durante el trabajo de parto y el parto.  Comente la posibilidad de que necesite una cesrea y qu anestesia se recibir.  Informe a su mdico si sufre violencia familiar mental o fsica. A veces, se indica la prueba especializada sin estrs, la prueba de tolerancia a las contracciones y el perfil biofsico para asegurarse de que el beb no tiene problemas. El estudio del lquido amnitico que rodea al beb se llama amniocentesis. El lquido amnitico se obtiene introduciendo una aguja en el vientre (abdomen ). En ocasiones se lleva a cabo cerca del final del embarazo, si es necesario inducir a un parto. En este caso se realiza para asegurarse que los pulmones del beb estn lo suficientemente maduros como para que pueda vivir fuera del tero. Si los pulmones no han madurado y es peligroso que el beb nazca, se administrar a la madre una inyeccin de cortisona , 1 a 2 das antes del parto. . Esto ayuda a que los pulmones del beb maduren y sea ms seguro su nacimiento.  CAMBIOS QUE OCURREN EN EL TERCER TRIMESTRE DEL EMBARAZO  Su organismo atravesar numerosos cambios durante el embarazo. Estos pueden variar de una persona a otra. Converse con el profesional que la asiste acerca los cambios que   usted note y que la preocupen.   Durante el ltimo trimestre probablemente sienta un aumento del apetito. Es normal tener "antojos" de ciertas comidas. Esto vara de una persona a otra y de un embarazo a otro.  Podrn aparecer las primeras estras en las caderas, abdomen y mamas. Estos son cambios normales del cuerpo durante el embarazo. No existen  medicamentos ni ejercicios que puedan prevenir estos cambios.  La constipacin puede tratarse con un laxante o agregando fibra a su dieta. Beber grandes cantidades de lquidos, tomar fibras en forma de vegetales, frutas y granos integrales es de gran ayuda.  Tambin es beneficioso practicar actividad fsica. Si ha sido una persona activa hasta el embarazo, podr continuar con la mayora de las actividades durante el mismo. Si ha sido menos activa, puede ser beneficioso que comience con un programa de ejercicios, como realizar caminatas. Consulte con el profesional que la asiste antes de comenzar un programa de ejercicios.  Evite el consumo de cigarrillos, el alcohol, los medicamentos no recetados y las "drogas de la calle" durante el embarazo. Estas sustancias qumicas afectan la formacin y el desarrollo del beb. Evite estas sustancias durante todo el embarazo para asegurar el nacimiento de un beb sano.  Podr sentir dolor de espalda, tener vrices en las venas y hemorroides, o si ya los sufra, pueden empeorar.  Durante el tercer trimestre se cansar con ms facilidad, lo cual es normal.  Los movimientos del beb pueden ser ms fuertes y con ms frecuencia.  Puede que note dificultades para respirar normalmente.  El ombligo puede salir hacia afuera.  A veces sale una secrecin amarilla de las mamas, que se llama calostro.  Podr aparecer una secrecin mucosa con sangre. Esto suele ocurrir entre unos pocos das y una semana antes del parto. INSTRUCCIONES PARA EL CUIDADO EN EL HOGAR   Cumpla con las citas de control. Siga las indicaciones del mdico con respecto al uso de medicamentos, los ejercicios y la dieta.  Durante el embarazo debe obtener nutrientes para usted y para su beb. Consuma alimentos balanceados a intervalos regulares. Elija alimentos como carne, pescado, leche y otros productos lcteos descremados, vegetales, frutas, panes integrales y cereales. El mdico le informar  cul es el aumento de peso ideal.  Las relaciones sexuales pueden continuarse hasta casi el final del embarazo, si no se presentan otros problemas como prdida prematura (antes de tiempo) de lquido amnitico, hemorragia vaginal o dolor en el vientre (abdominal).  Realice actividad fsica todos los das, si no tiene restricciones. Consulte con el profesional que la asiste si no sabe con certeza si determinados ejercicios son seguros. El mayor aumento de peso se producir en los ltimos 2 trimestres del embarazo. El ejercicio ayuda a:  Controlar su peso.  Mantenerse en forma para el trabajo de parto y el parto .  Perder peso despus del parto.  Haga reposo con frecuencia, con las piernas elevadas, o segn lo necesite para evitar los calambres y el dolor de cintura.  Use un buen sostn o como los que se usan para hacer deportes para aliviar la sensibilidad de las mamas. Tambin puede serle til si lo usa mientras duerme. Si pierde calostro, podr utilizar apsitos en el sostn.  No utilice la baera con agua caliente, baos turcos y saunas.   Colquese el cinturn de seguridad cuando conduzca. Este la proteger a usted y al beb en caso de accidente.  Evite comer carne cruda y el contacto con los utensilios y desperdicios de los gatos. Estos   elementos contienen grmenes que pueden causar defectos de nacimiento en el beb.  Es fcil perder algo de orina durante el embarazo. Apretar y fortalecer los msculos de la pelvis la ayudar con este problema. Practique detener la miccin cuando est en el bao. Estos son los mismos msculos que necesita fortalecer. Son tambin los mismos msculos que utiliza cuando trata de evitar despedir gases. Puede practicar apretando estos msculos diez veces, y repetir esto tres veces por da aproximadamente. Una vez que conozca qu msculos debe apretar, no realice estos ejercicios durante la miccin. Puede favorecerle una infeccin si la orina vuelve hacia  atrs.  Pida ayuda si tienen necesidades financieras, teraputicas o nutricionales. El profesional podr ayudarla con respecto a estas necesidades, o derivarla a otros especialistas.  Haga una lista de nmeros telefnicos de emergencia y tngalos disponibles.  Planifique como obtener ayuda de familiares o amigos cuando regrese a casa desde el hospital.  Hacer un ensayo sobre la partida al hospital.  Tome clases prenatales con el padre para entender, practicar y hacer preguntas sobre el trabajo de parto y el alumbramiento.  Preparar la habitacin del beb / busque una guardera.  No viaje fuera de la ciudad a menos que sea absolutamente necesario y con el asesoramiento de su mdico.  Use slo zapatos de tacn bajo o sin tacn para tener mejor equilibrio y evitar cadas. USO DE MEDICAMENTOS Y CONSUMO DE DROGAS DURANTE EL EMBARAZO   Tome las vitaminas apropiadas para esta etapa tal como se le indic. Las vitaminas deben contener un miligramo de cido flico. Guarde todas las vitaminas fuera del alcance de los nios. La ingestin de slo un par de vitaminas o tabletas que contengan hierro pueden ocasionar la muerte en un beb o en un nio pequeo.  Evite el uso de todos los medicamentos, incluyendo hierbas, medicamentos de venta libre, sin receta o que no hayan sido sugeridos por su mdico. Slo tome medicamentos de venta libre o medicamentos recetados para el dolor, el malestar o fiebre como lo indique su mdico. No tome aspirina, ibuprofeno o naproxeno excepto que su mdico se lo indique.  Infrmele al profesional si consume alguna droga.  El alcohol se relaciona con ciertos defectos congnitos. Incluye el sndrome de alcoholismo fetal. Debe evitar absolutamente el consumo de alcohol, en cualquier forma. El fumar produce baja tasa de natalidad y bebs prematuros.  Las drogas ilegales o de la calle son muy perjudiciales para el beb. Estn absolutamente prohibidas. Un beb que nace de una  madre adicta, ser adicto al nacer. Ese beb tendr los mismos sntomas de abstinencia que un adulto. SOLICITE ATENCIN MDICA SI:  Tiene preguntas o preocupaciones relacionadas con el embarazo. Es mejor que llame para formular las preguntas si no puede esperar hasta la prxima visita, que sentirse preocupada por ellas.  SOLICITE ATENCIN MDICA DE INMEDIATO SI:   La temperatura oral le sube a ms de 38,9 C (102 F) o lo que su mdico le indique.  Tiene una prdida de lquido por la vagina (canal de parto). Si sospecha una ruptura de las membranas, tmese la temperatura y llame al profesional para informarlo sobre esto.  Observa unas pequeas manchas, una hemorragia vaginal o elimina cogulos. Notifique al profesional acerca de la cantidad y de cuntos apsitos est utilizando.  Presenta un olor desagradable en la secrecin vaginal y observa un cambio en el color, de transparente a blanco.  Ha vomitado durante ms de 24 horas.  Siente escalofros o le sube la fiebre.  Le   falta el aire.  Siente ardor al orinar.  Baja o sube ms de 2 libras (900 g), o segn lo indicado por el profesional que la asiste.  Observa que sbitamente se le hinchan el rostro, las manos, los pies o las piernas.  Siente dolor en el vientre (abdominal). Las molestias en el ligamento redondo son una causa benigna frecuente de dolor abdominal durante el embarazo. El profesional que la asiste deber evaluarla.  Presenta dolor de cabeza intenso que no se alivia.  Tiene problemas visuales, visin doble o borrosa.  Si no siente los movimientos del beb durante ms de 1 hora. Si piensa que el beb no se mueve tanto como lo haca habitualmente, coma algo que contenga azcar y recustese sobre el lado izquierdo durante una hora. El beb debe moverse al menos 4  5 veces por hora. Comunquese inmediatamente si el beb se mueve menos que lo indicado.  Se cae, se ve involucrada en un accidente automovilstico o sufre algn  tipo de traumatismo.  En su hogar hay violencia mental o fsica. Document Released: 02/10/2005 Document Revised: 01/26/2012 [employer] Patient Information 2014 [employer].  Lactancia materna  (Breastfeeding)  El cambio hormonal durante el embarazo produce el desarrollo del tejido mamario y un aumento en el nmero y tamao de los conductos galactforos. La hormona prolactina permite que las protenas, los azcares y las grasas de la sangre produzcan la leche materna en las glndulas productoras de leche. La hormona progesterona impide que la leche materna sea liberada antes del nacimiento del beb. Despus del nacimiento del beb, su nivel de progesterona disminuye permitiendo que la leche materna sea liberada. Pensar en el beb, as como la succin o el llanto, pueden estimular la liberacin de leche de las glndulas productoras de leche.  La decisin de amamantar (lactar) es una de las mejores opciones que usted puede hacer para usted y su beb. La informacin que sigue da una breve resea de los beneficios, as como otras caractersticas importantes que debe saber sobre la lactancia materna.  LOS BENEFICIOS DE AMAMANTAR  Para el beb   La primera leche (calostro) ayuda al mejor funcionamiento del sistema digestivo del beb.   La leche tiene anticuerpos que provienen de la madre y que ayudan a prevenir las infecciones en el beb.   El beb tiene una menor incidencia de asma, alergias y del sndrome de muerte sbita del lactante (SMSL).   Los nutrientes de la leche materna son mejores para el beb que la leche maternizada.  La leche materna mejora el desarrollo cerebral del beb.   Su beb tendr menos gases, clicos y estreimiento.  Es menos probable que el beb desarrolle otras enfermedades, como obesidad infantil, asma o diabetes mellitus. Para usted   La lactancia materna favorece el desarrollo de un vnculo muy especial entre la madre y el beb.   Es ms conveniente,  siempre disponible, a la temperatura adecuada y econmica.   La lactancia materna ayuda a quemar caloras y a perder el peso ganado durante el embarazo.   Hace que el tero se contraiga ms rpidamente a su tamao normal y disminuye el sangrado despus del parto.   Las madres que amamantan tienen menos riesgo de desarrollar osteoporosis o cncer de mama o de ovario en el futuro.  FRECUENCIA DEL AMAMANTAMIENTO   Un beb sano, nacido a trmino, puede amamantarse con tanta frecuencia como cada hora, o espaciar las comidas cada tres horas. La frecuencia en la lactancia varan de un beb a   otro.   Los recin nacidos deben ser alimentados por lo menos cada 2-3 horas durante el da y cada 4-5 horas durante la noche. Usted debe amamantarlo un mnimo de 8 tomas en un perodo de 24 horas.  Despierte al beb para amamantarlo si han pasado 3-4 horas desde la ltima comida.  Amamante cuando sienta la necesidad de reducir la plenitud de sus senos o cuando el beb muestre signos de hambre. Las seales de que el beb puede tener hambre son:  Aumenta su estado de alerta o vigilancia.  Se estira.  Mueve la cabeza de un lado a otro.  Mueve la cabeza y abre la boca cuando se le toca la mejilla o la boca (reflejo de succin).  Aumenta las vocalizaciones, tales como sonidos de succin, relamerse los labios, arrullos, suspiros, o chirridos.  Mueve la mano hacia la boca.  Se chupa con ganas los dedos o las manos.  Agitacin.  Llanto intermitente.  Los signos de hambre extrema requerirn que lo calme y lo consuele antes de tratar de alimentarlo. Los signos de hambre extrema son:  Agitacin.  Llanto fuerte e intenso.  Gritos.  El amamantamiento frecuente la ayudar a producir ms leche y a prevenir problemas de dolor en los pezones e hinchazn de las mamas.  LACTANCIA MATERNA   Ya sea que se encuentre acostada o sentada, asegrese que el abdomen del beb est enfrente el suyo.   Sostenga  la mama con el pulgar por arriba y los otros 4 dedos por debajo del pezn. Asegrese que sus dedos se encuentren lejos del pezn y de la boca del beb.   Empuje suavemente los labios del beb con el pezn o con el dedo.   Cuando la boca del beb se abra lo suficiente, introduzca el pezn y la zona oscura que lo rodea (areola) tanto como le sea posible dentro de la boca.  Debe haber ms areola visible por arriba del labio superior que por debajo del labio inferior.  La lengua del beb debe estar entre la enca inferior y el seno.  Asegrese de que la boca del beb est en la posicin correcta alrededor del pezn (prendida). Los labios del beb deben crear un sello sobre su pecho.  Las seales de que el beb se ha prendido eficazmente al pezn son:  Tironea o succiona sin dolor.  Se escucha que traga entre las succiones.  No hace ruidos ni chasquidos.  Hay movimientos musculares por arriba y por delante de sus odos al succionar.  El beb debe succionar unos 2-3 minutos para que salga la leche. Permita que el nio se alimente en cada mama todo lo que desee. Alimente al beb hasta que se desprenda o se quede dormido en el primer pecho y luego ofrzcale el segundo pecho.  Las seales de que el beb est lleno y satisfecho son:  Disminuye gradualmente el nmero de succiones o no succiona.  Se queda dormido.  Extiende o relaja su cuerpo.  Retiene una pequea cantidad de leche en la boca.  Se desprende del pecho por s mismo.  Los signos de una lactancia materna eficaz son:  Los senos han aumentado la firmeza, el peso y el tamao antes de la alimentacin.  Son ms blandos despus de amamantar.  Un aumento del volumen de leche, y tambin el cambio de su consistencia y color se producen hacia el quinto da de lactancia materna.  La congestin mamaria se alivia al dar de mamar.  Los pezones no duelen,   ni estn agrietados ni sangran.  De ser necesario, interrumpa la succin  poniendo su dedo en la esquina de la boca del beb y deslizando el dedo entre sus encas. A continuacin, retire la mama de su boca.  Es comn que los bebs regurgiten un poco despus de comer.  A menudo los bebs tragan aire al alimentarse. Esto puede hacer que se sienta molesto. Hacer eructar al beb al cambiar de pecho puede ser de ayuda.  Se recomiendan suplementos de vitamina D para los bebs que reciben slo leche materna.  Evite el uso del chupete durante las primeras 4 a 6 semanas de vida.  Evite la alimentacin suplementaria con agua, frmula o jugo en lugar de la leche materna. La leche materna es todo el alimento que el beb necesita. No es necesario que el nio ingiera agua o preparados de bibern. Sus pechos producirn ms leche si se evita la alimentacin suplementaria durante las primeras semanas. COMO SABER SI EL BEB OBTIENE LA SUFICIENTE LECHE MATERNA  Preguntarse si el beb obtiene la cantidad suficiente de leche es una preocupacin frecuente entre las madres. Puede asegurarse que el beb tiene la leche suficiente si:   El beb succiona activamente y usted escucha que traga.   El beb parece estar relajado y satisfecho despus de mamar.   El nio se alimenta al menos 8 a 12 veces en 24 horas.  Durante los primeros 3 a 5 das de vida:  Moja 3-5 paales en 24 horas. La materia fecal debe ser blanda y amarillenta.  Tiene al menos 3 a 4 deposiciones en 24 horas. La materia fecal debe ser blanda y amarillenta.  A los 5-7 das de vida, el beb debe tener al menos 3-6 deposiciones en 24 horas. La materia fecal debe ser grumosa y amarilla a los 5 das de vida.  Su beb tiene una prdida de peso menor a 7al 10% durante los primeros 3 das de vida.  El beb no pierde peso despus de 3-7 das de vida.  El beb debe aumentar 4 a 6 libras (120 a 170 gr.) por semana despus de los 4 das de vida.  Aumenta de peso a los 5 das de vida y vuelve al peso del nacimiento dentro de  las 2 semanas. CONGESTIN MAMARIA  Durante la primera semana despus del parto, usted puede experimentar hinchazn en las mamas (congestin mamaria). Al estar congestionadas, las mamas se sienten pesadas, calientes o sensibles al tacto. El pico de la congestin ocurre a las 24 -48 horas despus del parto.   La congestin puede disminuirse:  Continuando con la lactancia materna.  Aumentando la frecuencia.  Tomando duchas calientes o aplicando calor hmedo en los senos antes de cada comida. Esto aumenta la circulacin y ayuda a que la leche fluya.   Masajeando suavemente el pecho antes y durante las comidas. Con las yemas de los dedos, masajee la pared del pecho hacia el pezn en un movimiento circular.   Asegurarse de que el beb vaca al menos uno de sus pechos en cada alimentacin. Tambin ayuda si comienza la siguiente toma en el otro seno.   Extraiga manualmente o con un sacaleches las mamas para vaciar los pechos si el beb tiene sueo o no se aliment bien. Tambin puede extraer la leche cuando vuelva a trabajar o si siente que se estn congestionando las mamas.  Asegrese de que el beb se prende y est bien colocado durante la lactancia. Si sigue estas indicaciones, la congestin debe mejorar   en 24 a 48 horas. Si an tiene dificultades, consulte a su asesor en lactancia.  CUDESE USTED MISMA  Cuide sus mamas.   Bese o dchese diariamente.   Evite usar jabn en los pezones.   Use un sostn de soporte Evite el uso de sostenes con aro.  Seque al aire sus pezones durante 3-4 minutos despus de cada comida.   Utilice slo apsitos de algodn en el sostn para absorber las prdidas de leche. La prdida de un poco de leche materna entre las comidas es normal.   Use solamente lanolina pura en sus pezones despus de amamantar. Usted no tiene que lavarla antes de alimentar al beb. Otra opcin es sacarse unas gotas de leche y masajear suavemente los pezones.  Continuar con  los autocontroles de la mama. Cudese.   Consuma alimentos saludables. Alterne 3 comidas con 3 colaciones.  Evite los alimentos que usted nota que perjudican al beb.  Beba leche, jugos de fruta y agua para satisfacer su sed (aproximadamente 8 vasos al da).   Descanse con frecuencia, reljese y tome sus vitaminas prenatales para evitar la fatiga, el estrs y la anemia.  Evite masticar y fumar tabaco.  Evite el consumo de alcohol y drogas.  Tome medicamentos de venta libre y recetados tal como le indic su mdico o farmacutico. Siempre debe consultar con su mdico o farmacutico antes de tomar cualquier medicamento, vitamina o suplemento de hierbas.  Sepa que durante la lactancia puede quedar embarazada. Si lo desea, hable con su mdico acerca de la planificacin familiar y los mtodos anticonceptivos seguros que puede utilizar durante la lactancia. SOLICITE ATENCIN MDICA SI:   Usted siente que quiere dejar de amamantar o se siente frustrada con la lactancia.  Siente dolor en los senos o en los pezones.  Sus pezones estn agrietados o sangran.  Sus pechos estn irritados, sensibles o calientes.  Tiene un rea hinchada en cualquiera de los senos.  Siente escalofros o fiebre.  Tiene nuseas o vmitos.  Observa un drenaje en los pezones.  Sus mamas no se llenan antes de amamantarlo al 5to da despus del parto.  Se siente triste y deprimida.  El nio est demasiado somnoliento como para comer.  El nio tiene problemas para respirar.   Moja menos de 3 paales en 24 horas.  Mueve el intestino menos de 3 veces en 24 horas.  La piel del beb o la parte blanca de sus ojos est ms amarilla.   El beb no ha aumentado de peso a los 5 das de vida. ASEGRESE DE QUE:   Comprende estas instrucciones.  Controlar su enfermedad.  Solicitar ayuda de inmediato si no mejora o si empeora. Document Released: 05/03/2005 Document Revised: 01/26/2012 [employer] Patient  Information 2014 [employer].  

## 2013-02-19 NOTE — Progress Notes (Signed)
U/S 10/2 shows resolved pyelectasis, vtx, AFI 21, EFW 1844 gms, 4# 1 oz (51%) To begin 2x/wk testing today NST reviewed and reactive.

## 2013-02-19 NOTE — Progress Notes (Signed)
Pulse- 83 Patient reports a lot of pelvic pressure

## 2013-02-23 ENCOUNTER — Ambulatory Visit (INDEPENDENT_AMBULATORY_CARE_PROVIDER_SITE_OTHER): Payer: Self-pay | Admitting: *Deleted

## 2013-02-23 ENCOUNTER — Encounter: Payer: Self-pay | Admitting: *Deleted

## 2013-02-23 VITALS — BP 118/64

## 2013-02-23 DIAGNOSIS — O10019 Pre-existing essential hypertension complicating pregnancy, unspecified trimester: Secondary | ICD-10-CM

## 2013-02-23 DIAGNOSIS — O10013 Pre-existing essential hypertension complicating pregnancy, third trimester: Secondary | ICD-10-CM

## 2013-02-23 NOTE — Progress Notes (Signed)
P=93 

## 2013-02-26 ENCOUNTER — Ambulatory Visit (INDEPENDENT_AMBULATORY_CARE_PROVIDER_SITE_OTHER): Payer: Self-pay | Admitting: Obstetrics & Gynecology

## 2013-02-26 VITALS — BP 132/83 | Temp 97.0°F | Wt 221.5 lb

## 2013-02-26 DIAGNOSIS — Z789 Other specified health status: Secondary | ICD-10-CM

## 2013-02-26 DIAGNOSIS — O0990 Supervision of high risk pregnancy, unspecified, unspecified trimester: Secondary | ICD-10-CM

## 2013-02-26 DIAGNOSIS — O0992 Supervision of high risk pregnancy, unspecified, second trimester: Secondary | ICD-10-CM

## 2013-02-26 DIAGNOSIS — Z609 Problem related to social environment, unspecified: Secondary | ICD-10-CM

## 2013-02-26 DIAGNOSIS — O10019 Pre-existing essential hypertension complicating pregnancy, unspecified trimester: Secondary | ICD-10-CM

## 2013-02-26 LAB — POCT URINALYSIS DIP (DEVICE)
Bilirubin Urine: NEGATIVE
Glucose, UA: NEGATIVE mg/dL
Ketones, ur: NEGATIVE mg/dL
pH: 7.5 (ref 5.0–8.0)

## 2013-02-26 NOTE — Progress Notes (Signed)
Pulse- 87  Edema-feet  

## 2013-02-26 NOTE — Patient Instructions (Signed)
Regrese a la clinica cuando tenga su cita. Si tiene problemas o preguntas, llama a la clinica o vaya a la sala de emergencia al [employer]. Health Dept [phone]   Madilyn Fireman difteria/ttanos (Td) o [place] difteria, ttanos, tos convulsa (Tdap), Lo que debe saber (Tetanus, Diphtheria [Td] or Tetanus, Diphtheria, Pertussis [Tdap] Vaccine, What You Need to Know) PORQU VACUNARSE? El ttanos , la difteria y la tos ferina pueden ser enfermedades graves.  El TTANOS  (trismo) provoca la contraccin dolorosa y rigidez de los msculos, por lo general, en todo el cuerpo.   Puede causar la contraccin de los msculos de la cabeza y el cuello de modo que el enfermo no puede abrir la boca ni tragar., y en algunos casos, tampoco puede respirar.. El ttanos causa la muerte de 1 de cada 5 personas que se infectan. LA DIFTERIA produce la formacin de una membrana gruesa que cubre el fondo de la garganta.  Puede causar problemas respiratorios, parlisis, insuficiencia cardaca, e incluso la muerte. El PERTUSIS (tos [place]) causa ataques de tos intensa que pueden dificultar la respiracin, provocar vmitos e interrumpir el sueo.   Puede causar prdida de peso, incontinencia, fractura de [place], y desmayos por la intensa tos. Hasta de 2 de cada 100 adolescentes y 5 de cada 100 adultos que enferman de tos [place] deben ser hospitalizados o tienen complicaciones como la neumona y la [place]. Estas 3 enfermedades son provocadas por bacterias. La difteria y la tos Benetta Spar se [place] de persona a Social worker. El ttanos ingresa al organismo a travs de cortes, rasguos o heridas. En los [place] ocurran alrededor de 200 000 casos por ao de difteria y tos [place], antes de que existieran las [place], y tambin ocurran cientos de casos de ttanos. Desde la aparicin de las vacunas, el ttanos y la difteria han disminuido en alrededor del 99% y los casos de tos ferina disminuyeron aproximadamente el 92%.  Los  nios menores de 6 aos deben recibir la vacuna DTaP para estar protegidos contra estas tres enfermedades. Pero los [employer], los adolescentes y los adultos tambin necesitan proteccin. VACUNAS PARA ADOLESCENTES Y ADULTOS Vacunas Tdap y Td  Hay dos vacunas disponibles para proteger de estas enfermedades a nios a Glass blower/designer de los 7aos:   La vacuna Td fue utilizada durante muchos aos. [employer]ge contra el ttanos y la difteria.  La vacuna Tdap fue autorizada en 2005. Es la primera vacuna para adolescentes y adultos que protege contra la tos ferina y el ttanos y la difteria. Una dosis de refuerzo de la Td se recomienda cada 10 aos. La Tdap se aplica slo una vez.  QU VACUNA DEBO APLICARME Y CUANDO? Las edades de 7 a 18 aos  [employer] 11 y los 12 aos se recomienda una dosis de Tdap. Esta dosis puede aplicarse desde los 7 aos en los nios que no han recibido una o ms dosis de DTaP anteriormente.  Los nios y adolescentes que no recibieron todas las dosis programadas de DTaP o DTP a los 7 aos deben completar la serie usando una combinacin de Td y Tdap. Adultos de 19 aos o ms  [employer] adultos deben recibir una dosis de refuerzo de Td cada 10 aos. Los adultos de menos de 65 aos que nunca hayan recibido la Tdap deben reemplazarla por la siguiente dosis de refuerzo. Los adultos a partir de los 65 aos puedenrecibir una dosis de Tdap.  Los adultos (incluyendo las mujeres que podran quedar embarazadas y los adultos  mayores de 65 aos) que tienen contacto cercano con un beb menor de 12 meses deben aplicarse una dosis de Tdap para proteger al beb de la tos [place].  Los trabajadores de la salud que tengan contacto directo con pacientes en hospitales o clnicas deben recibir una dosis de Tdap. Proteccin despus de [place] herida  Es posible que una persona que tenga un corte o quemadura grave necesite una dosis de Td o Tdap para prevenir la infeccin por ttanos. Puede usarse la Tdap en  personas que nunca recibieron una dosis. Pero debe usarse la Td, si la Tdap no se encuentra disponible, o para:  Cualquier persona que haya recibido una dosis de Tdap.  Los nios [employer] 7 y los 9 aos que han [employer] series de DTap anteriormente.  Adultos de 65 aos o ms. Mujeres embarazadas.   Las mujeres embarazadas que nunca recibieron una dosis de Ddap deben recibirla despus de la 20a semana de gestacin y preferiblemente durante Contractor. trimestre. Si no se aplican la Tdap durante el embarazo, deben recibirla lo antes posible despus del parto. Las mujeres embarazadas que han recibido la Tdap y tienen que aplicarse la vacuna contra el ttanos o la difteria durante el [place], deben recibir la Td. Las vacunas Tdap y Td pueden ser administradas al mismo tiempo que otras vacunas. ALGUNAS PERSONAS NO DEBEN RECIBIR LA VACUNA O DEBEN [employer]  Las personas que hayan tenido una reaccin alrgica que haya puesto en peligro su vida despus de una dosis de vacuna contra el ttanos, la difteria o la tos ferina no deben recibir Td ni Tdap..  Las personas que tengan alergias graves a algn componente de una vacuna no deben recibir esa vacuna. Informe a su mdico si la persona que recibe la vacuna sufre alergias graves.  Cualquier persona que [employer] en coma o que haya tenido convulsiones dentro de los 7 [address]  [address] posteriores despus de una dosis de DTP o DTaP no debe recibir la Tdap, salvo que se encuentre una causa que no fuera la vacuna. Estas personas pueden recibir Td.  Consulte a su mdico si la persona que recibe [place] de las vacunas:  Tiene epilepsia o algn otro problema del sistema nervioso.  Tuvo inflamacin o dolor intenso despus de una dosis de DTP, DTaP, DT, Td, o Tdap.  Ha tenido el sndrome de Scientific laboratory technician (GBS por sus siglas en ingls). Las personas que sufran una enfermedad moderada o grave el da en que se programa la vacuna, deben esperar a recuperarse para recibir  las vacunas Tdap o Td. Por lo general, una persona con una enfermedad leve o fiebre baja puede recibir la vacuna. CULES SON LOS RIESGOS DE LAS VACUNAS TDAP Y TD? Con Cathleen Corti, al igual que con cualquier [employer], siempre existe un pequeo riesgo de una reaccin alrgica que ponga en peligro la vida o cause otro problema grave. Todo procedimiento mdico, inclusive la vacunacin pueden causar breves episodios de lipotimia o sntomas relacionados (como movimientos espasmdicos). Para evitar los [employer] y las lesiones causadas por las cadas, permanezca sentado o recustese durante los 15 minutos posteriores a la vacunacin. Informe a su mdico si el paciente se siente dbil o mareado, tiene cambios en la visin o siente zumbidos en los odos.  Es mucho ms probable que tener ttanos, difteria, o tos ferina cause problemas ms graves que los provocados por recibir cualquiera de las vacunas Td o Tdap. A continuacin se enumeran los problemas informados despus de las vacunas Td  y Tdap. Problemas Leves (perceptibles, pero que no interfirieron con las actividades): Tdap  Dolor (alrededor de 3 de cada 4 adolescentes y 2 de cada 3 adultos).  Enrojecimiento o inflamacin en el sitio de la inyeccin (alrededor de 1 de cada 5).  Fiebre leve de al menos 100.4 F (38 C) (hasta alrededor de 1 cada 25 adolescentes y 1 de cada 100 adultos).  Dolor de cabeza (alrededor de 4 de cada 10 adolescentes y 3 de cada 10 adultos).  Cansancio (alrededor de 1 de cada 3 adolescentes y 1 de cada 4 adultos).  Nuseas, vmitos, diarrea, o dolor de estmago (hasta 1 de cada 4 adolescentes y 1 de cada 10 adultos).  Escalofros, dolores corporales, dolor articular, erupciones, o inflamacin de las glndulas (poco frecuente). Td  Dolor (hasta alrededor de 8 de cada 10).  Enrojecimiento o inflamacin de la inyeccin (alrededor de 1 de cada 3).  Fiebre leve (hasta alrededor de 1 de cada 5).  Dolor de cabeza o  cansancio (poco frecuente). Problemas Moderados (interfieren con las [place], [place] no requieren atencin mdica): Tdap  Dolor en el sitio de la inyeccin (alrededor de 1 de cada 20 adolescentes y 1 de cada 100 adultos).  Enrojecimiento o inflamacin de la inyeccin (alrededor de 1 de cada 16 adolescentes y 1 de cada 25 adultos).  Fiebre de ms de 102 F (38.9 C) (alrededor de 1 de cada 100 adolescentes y 1 de cada 250 adultos).  Dolor de cabeza (1 de cada 300).  Nuseas, vmitos, diarrea, o dolor de estmago (hasta 3 de cada 100 adolescentes y 1 de cada 100 adultos). Td  Fiebre de ms de 102 F (38.9 C) (poco comn). Tdap o Td  Inflamacin de gran extensin en el brazo en el que se aplic la vacuna (hasta 3 de cada 100). Problemas Graves (no puede realizar [employer]; requiere Psychologist, prison and probation services) Tdap o Td  Inflamacin, dolor intenso, sangrado y enrojecimiento en el brazo, en el sitio de la inyeccin (poco frecuente). Puede producirse una reaccin alrgica grave despus de cualquier vacuna. Se estima que estas reacciones ocurren en menos de una de cada un milln de dosis. QU PASA SI HAY UNA REACCIN GRAVE? Qu signos debo buscar? Cualquier estado poco habitual, como una reaccin alrgica grave o fiebre alta. Si le produce Runner, broadcasting/film/video grave, se manifestar dentro de algunos minutos a una hora despus de recibir la vacuna. Entre los signos de Automotive engineer grave se encuentran la dificultad para respirar, debilidad, ronquera o sibilancias, latidos cardacos acelerados, urticaria, mareos, palidez, o inflamacin de la garganta. Qu debo hacer?  Comunquese con su mdico o lleve inmediatamente a la persona al mdico.  Dgale a su mdico qu ocurri, la fecha y hora en que sucedi y cundo le aplicaron la vacuna.  Pida a su mdico que informe sobre la reaccin llenando un formulario del Sistema de Informacin de Reacciones Adversos a las Administrator, arts (VAERS, por  sus siglas en ingls). O, puede presentar este informe a travs del sitio web de VAERS [email or web address].LAgents.no o puede llamar al [phone]. VAERS no brinda asistencia mdica. PROGRAMA NACIONAL DE COMPENSACIN DE DAOS POR VACUNAS El [place] de Compensacin de Daos por Vacunas (VICP) fue creado en 1986.  Aquellas personas que consideren que han sufrido un dao como consecuencia de una vacuna y quieren saber ms acerca del programa y como presentar Roslynn Amble, [place] llamar al [phone] o visitar su sitio web en SpiritualWord.at  CMO Roxan Diesel MS INFORMACIN?  El profesional  podr darle el prospecto de la vacuna o sugerirle otras fuentes de informacin.  Comunquese con el servicio de salud de su localidad o [address].  Comunquese con los Centros para el control y la prevencin de Child psychotherapist for Disease Control and Prevention , CDC).  Llame al [phone] (1-800-CDC-INFO).  Visite los sitios web de [employer] en PicCapture.uy CDC Td and Tdap Interim VIS-Spanish (06/09/10) Document Released: 08/19/2008 Document Revised: 07/26/2011 ExitCare Patient Information 2014 [place], [place].

## 2013-02-26 NOTE — Progress Notes (Signed)
Patient is Spanish-speaking only, Spanish interpreter present for this encounter.  BP stable.  NST performed today was reviewed and was found to be reactive.  Continue recommended antenatal testing and prenatal care. 02/15/13 EFW 51%, nml AFV.  Counseled about Tdap vaccine, patient to consider this, may get at [facility] Dept.  No other complaints or concerns.  Fetal movement and labor precautions reviewed.

## 2013-03-02 ENCOUNTER — Ambulatory Visit (INDEPENDENT_AMBULATORY_CARE_PROVIDER_SITE_OTHER): Payer: Self-pay | Admitting: *Deleted

## 2013-03-02 VITALS — BP 131/63

## 2013-03-02 DIAGNOSIS — O10013 Pre-existing essential hypertension complicating pregnancy, third trimester: Secondary | ICD-10-CM

## 2013-03-02 DIAGNOSIS — O10019 Pre-existing essential hypertension complicating pregnancy, unspecified trimester: Secondary | ICD-10-CM

## 2013-03-02 NOTE — Progress Notes (Signed)
P-102 

## 2013-03-02 NOTE — Progress Notes (Signed)
NST reviewed and reactive.  

## 2013-03-05 ENCOUNTER — Ambulatory Visit (HOSPITAL_COMMUNITY)
Admission: RE | Admit: 2013-03-05 | Discharge: 2013-03-05 | Disposition: A | Discharge status: Home / Self Care | Payer: Self-pay | Source: Ambulatory Visit | Attending: Obstetrics and Gynecology | Admitting: Obstetrics and Gynecology

## 2013-03-05 ENCOUNTER — Other Ambulatory Visit: Payer: Self-pay | Admitting: Obstetrics and Gynecology

## 2013-03-05 ENCOUNTER — Ambulatory Visit (INDEPENDENT_AMBULATORY_CARE_PROVIDER_SITE_OTHER): Payer: Self-pay | Admitting: Obstetrics and Gynecology

## 2013-03-05 VITALS — BP 121/78 | Temp 97.7°F | Wt 223.5 lb

## 2013-03-05 DIAGNOSIS — O368390 Maternal care for abnormalities of the fetal heart rate or rhythm, unspecified trimester, not applicable or unspecified: Secondary | ICD-10-CM

## 2013-03-05 DIAGNOSIS — O10019 Pre-existing essential hypertension complicating pregnancy, unspecified trimester: Secondary | ICD-10-CM

## 2013-03-05 DIAGNOSIS — O10013 Pre-existing essential hypertension complicating pregnancy, third trimester: Secondary | ICD-10-CM

## 2013-03-05 DIAGNOSIS — O289 Unspecified abnormal findings on antenatal screening of mother: Secondary | ICD-10-CM | POA: Insufficient documentation

## 2013-03-05 DIAGNOSIS — O99810 Abnormal glucose complicating pregnancy: Secondary | ICD-10-CM | POA: Insufficient documentation

## 2013-03-05 DIAGNOSIS — E669 Obesity, unspecified: Secondary | ICD-10-CM | POA: Insufficient documentation

## 2013-03-05 LAB — POCT URINALYSIS DIP (DEVICE)
Bilirubin Urine: NEGATIVE
Ketones, ur: NEGATIVE mg/dL
Protein, ur: NEGATIVE mg/dL
Specific Gravity, Urine: 1.02 (ref 1.005–1.030)
pH: 7.5 (ref 5.0–8.0)

## 2013-03-05 NOTE — Progress Notes (Signed)
Patient doing well without complaints. FM/PTL precautions reviewed. NST not reactive today with a few variable decels- patient sent for BPP now.

## 2013-03-05 NOTE — Progress Notes (Signed)
P= 90 pt reports pain when baby moves. Dr. Jolayne Panther reviewed strip ordered BPP

## 2013-03-09 ENCOUNTER — Ambulatory Visit (INDEPENDENT_AMBULATORY_CARE_PROVIDER_SITE_OTHER): Payer: Self-pay | Admitting: *Deleted

## 2013-03-09 VITALS — BP 117/69

## 2013-03-09 DIAGNOSIS — O403XX1 Polyhydramnios, third trimester, fetus 1: Secondary | ICD-10-CM

## 2013-03-09 DIAGNOSIS — O10013 Pre-existing essential hypertension complicating pregnancy, third trimester: Secondary | ICD-10-CM

## 2013-03-09 DIAGNOSIS — O10019 Pre-existing essential hypertension complicating pregnancy, unspecified trimester: Secondary | ICD-10-CM

## 2013-03-09 DIAGNOSIS — O409XX0 Polyhydramnios, unspecified trimester, not applicable or unspecified: Secondary | ICD-10-CM

## 2013-03-09 NOTE — Progress Notes (Signed)
P = 97   Pt reports decreased FM x2 days

## 2013-03-12 ENCOUNTER — Ambulatory Visit (INDEPENDENT_AMBULATORY_CARE_PROVIDER_SITE_OTHER): Payer: Self-pay | Admitting: Obstetrics & Gynecology

## 2013-03-12 VITALS — BP 125/74 | Wt 226.7 lb

## 2013-03-12 DIAGNOSIS — O409XX0 Polyhydramnios, unspecified trimester, not applicable or unspecified: Secondary | ICD-10-CM

## 2013-03-12 DIAGNOSIS — O10013 Pre-existing essential hypertension complicating pregnancy, third trimester: Secondary | ICD-10-CM

## 2013-03-12 DIAGNOSIS — O403XX1 Polyhydramnios, third trimester, fetus 1: Secondary | ICD-10-CM

## 2013-03-12 DIAGNOSIS — O10019 Pre-existing essential hypertension complicating pregnancy, unspecified trimester: Secondary | ICD-10-CM

## 2013-03-12 LAB — POCT URINALYSIS DIP (DEVICE)
Glucose, UA: NEGATIVE mg/dL
Ketones, ur: NEGATIVE mg/dL
Specific Gravity, Urine: 1.02 (ref 1.005–1.030)
Urobilinogen, UA: 0.2 mg/dL (ref 0.0–1.0)

## 2013-03-12 NOTE — Patient Instructions (Signed)
Regrese a la clinica cuando tenga su cita. Si tiene problemas o preguntas, llama a la clinica o vaya a la sala [facility] emergencia al [facility].    

## 2013-03-12 NOTE — Progress Notes (Signed)
Patient is Spanish-speaking only, Spanish interpreter present for this encounter. Complains of GERD, recommended taking Prilosec as recommended. NST performed today was reviewed and was found to be reactive.  Continue recommended antenatal testing and prenatal care. Scheduled for growth scan on 03/15/13.  No other complaints or concerns.  Fetal movement and labor precautions reviewed.  Pelvic cultures next visit.

## 2013-03-12 NOTE — Progress Notes (Signed)
P = 82    Pt reports pelvic pressure.   [place] growth/NST @ [facility] on 10/30

## 2013-03-13 ENCOUNTER — Other Ambulatory Visit: Payer: Self-pay | Admitting: Obstetrics and Gynecology

## 2013-03-13 DIAGNOSIS — O409XX1 Polyhydramnios, unspecified trimester, fetus 1: Secondary | ICD-10-CM

## 2013-03-13 NOTE — Progress Notes (Signed)
NST 03/09/13 reactive

## 2013-03-14 ENCOUNTER — Encounter: Payer: Self-pay | Admitting: *Deleted

## 2013-03-15 ENCOUNTER — Other Ambulatory Visit: Payer: Self-pay | Admitting: Obstetrics and Gynecology

## 2013-03-15 ENCOUNTER — Ambulatory Visit (HOSPITAL_COMMUNITY)
Admission: RE | Admit: 2013-03-15 | Discharge: 2013-03-15 | Disposition: A | Discharge status: Home / Self Care | Payer: Self-pay | Source: Ambulatory Visit | Attending: Family Medicine | Admitting: Family Medicine

## 2013-03-15 ENCOUNTER — Ambulatory Visit (HOSPITAL_COMMUNITY): Admission: RE | Admit: 2013-03-15 | Payer: Self-pay | Source: Ambulatory Visit

## 2013-03-15 DIAGNOSIS — O409XX0 Polyhydramnios, unspecified trimester, not applicable or unspecified: Secondary | ICD-10-CM | POA: Insufficient documentation

## 2013-03-15 DIAGNOSIS — Z1389 Encounter for screening for other disorder: Secondary | ICD-10-CM | POA: Insufficient documentation

## 2013-03-15 DIAGNOSIS — O10019 Pre-existing essential hypertension complicating pregnancy, unspecified trimester: Secondary | ICD-10-CM | POA: Insufficient documentation

## 2013-03-15 DIAGNOSIS — E669 Obesity, unspecified: Secondary | ICD-10-CM | POA: Insufficient documentation

## 2013-03-15 DIAGNOSIS — Z363 Encounter for antenatal screening for malformations: Secondary | ICD-10-CM | POA: Insufficient documentation

## 2013-03-15 DIAGNOSIS — O409XX1 Polyhydramnios, unspecified trimester, fetus 1: Secondary | ICD-10-CM

## 2013-03-15 DIAGNOSIS — O358XX0 Maternal care for other (suspected) fetal abnormality and damage, not applicable or unspecified: Secondary | ICD-10-CM | POA: Insufficient documentation

## 2013-03-19 ENCOUNTER — Ambulatory Visit (INDEPENDENT_AMBULATORY_CARE_PROVIDER_SITE_OTHER): Payer: Self-pay | Admitting: Obstetrics & Gynecology

## 2013-03-19 ENCOUNTER — Encounter: Payer: Self-pay | Admitting: Obstetrics & Gynecology

## 2013-03-19 VITALS — BP 125/82 | Temp 96.8°F | Wt 224.3 lb

## 2013-03-19 DIAGNOSIS — O409XX0 Polyhydramnios, unspecified trimester, not applicable or unspecified: Secondary | ICD-10-CM

## 2013-03-19 DIAGNOSIS — O403XX1 Polyhydramnios, third trimester, fetus 1: Secondary | ICD-10-CM

## 2013-03-19 DIAGNOSIS — O10019 Pre-existing essential hypertension complicating pregnancy, unspecified trimester: Secondary | ICD-10-CM

## 2013-03-19 DIAGNOSIS — O10013 Pre-existing essential hypertension complicating pregnancy, third trimester: Secondary | ICD-10-CM

## 2013-03-19 LAB — POCT URINALYSIS DIP (DEVICE)
Bilirubin Urine: NEGATIVE
Glucose, UA: NEGATIVE mg/dL
Ketones, ur: NEGATIVE mg/dL
Nitrite: NEGATIVE
Protein, ur: NEGATIVE mg/dL
Specific Gravity, Urine: 1.02 (ref 1.005–1.030)
Urobilinogen, UA: 0.2 mg/dL (ref 0.0–1.0)
pH: 7.5 (ref 5.0–8.0)

## 2013-03-19 NOTE — Progress Notes (Signed)
P= 98 Pt. Reports pelvic pressure.  A little bit of yellow discharge but without odor or itching.

## 2013-03-19 NOTE — Progress Notes (Signed)
[place] for growth done 10/30 @ [facility]

## 2013-03-19 NOTE — Progress Notes (Signed)
NST reactive.  No problems.  Cultures done today.

## 2013-03-23 ENCOUNTER — Ambulatory Visit (INDEPENDENT_AMBULATORY_CARE_PROVIDER_SITE_OTHER): Payer: Self-pay | Admitting: *Deleted

## 2013-03-23 VITALS — BP 122/63

## 2013-03-23 DIAGNOSIS — O10019 Pre-existing essential hypertension complicating pregnancy, unspecified trimester: Secondary | ICD-10-CM

## 2013-03-23 DIAGNOSIS — O10013 Pre-existing essential hypertension complicating pregnancy, third trimester: Secondary | ICD-10-CM

## 2013-03-23 NOTE — Progress Notes (Signed)
P = 91 

## 2013-03-23 NOTE — Progress Notes (Signed)
NST reviewed and reactive.  

## 2013-03-26 ENCOUNTER — Ambulatory Visit (INDEPENDENT_AMBULATORY_CARE_PROVIDER_SITE_OTHER): Payer: Self-pay | Admitting: Obstetrics & Gynecology

## 2013-03-26 VITALS — BP 119/70 | Temp 97.4°F | Wt 223.3 lb

## 2013-03-26 DIAGNOSIS — Z789 Other specified health status: Secondary | ICD-10-CM

## 2013-03-26 DIAGNOSIS — O409XX0 Polyhydramnios, unspecified trimester, not applicable or unspecified: Secondary | ICD-10-CM

## 2013-03-26 DIAGNOSIS — O10013 Pre-existing essential hypertension complicating pregnancy, third trimester: Secondary | ICD-10-CM

## 2013-03-26 DIAGNOSIS — O139 Gestational [pregnancy-induced] hypertension without significant proteinuria, unspecified trimester: Secondary | ICD-10-CM

## 2013-03-26 DIAGNOSIS — Z609 Problem related to social environment, unspecified: Secondary | ICD-10-CM

## 2013-03-26 DIAGNOSIS — O403XX1 Polyhydramnios, third trimester, fetus 1: Secondary | ICD-10-CM

## 2013-03-26 DIAGNOSIS — O163 Unspecified maternal hypertension, third trimester: Secondary | ICD-10-CM

## 2013-03-26 DIAGNOSIS — O10019 Pre-existing essential hypertension complicating pregnancy, unspecified trimester: Secondary | ICD-10-CM

## 2013-03-26 LAB — POCT URINALYSIS DIP (DEVICE)
Glucose, UA: NEGATIVE mg/dL
Ketones, ur: NEGATIVE mg/dL
Nitrite: NEGATIVE
Specific Gravity, Urine: 1.015 (ref 1.005–1.030)
Urobilinogen, UA: 0.2 mg/dL (ref 0.0–1.0)
pH: 8.5 — ABNORMAL HIGH (ref 5.0–8.0)

## 2013-03-26 NOTE — Patient Instructions (Signed)
Return to clinic for any obstetric concerns or go to [facility] for evaluation  

## 2013-03-26 NOTE — Progress Notes (Signed)
Pulse-  95  Edema-feet  Pressure-belly "like baby pushing down"

## 2013-03-26 NOTE — Progress Notes (Signed)
Patient is Spanish-speaking only, Spanish interpreter present for this encounter.  NST performed today was reviewed and was found to be reactive.  Continue recommended antenatal testing and prenatal care.  Pelvic cultures done today. No other complaints or concerns.  Fetal movement and labor precautions reviewed.  Repeat growth scan in about 2 weeks if stil pregnant given size >>> dates. 10/30 /14 scan EFW 32%tile, normal AFI.

## 2013-03-27 LAB — GC/CHLAMYDIA PROBE AMP: GC Probe RNA: NEGATIVE

## 2013-03-29 LAB — CULTURE, BETA STREP (GROUP B ONLY)

## 2013-03-30 ENCOUNTER — Ambulatory Visit (INDEPENDENT_AMBULATORY_CARE_PROVIDER_SITE_OTHER): Payer: Self-pay | Admitting: *Deleted

## 2013-03-30 ENCOUNTER — Encounter: Payer: Self-pay | Admitting: Obstetrics & Gynecology

## 2013-03-30 VITALS — BP 125/79

## 2013-03-30 DIAGNOSIS — O10013 Pre-existing essential hypertension complicating pregnancy, third trimester: Secondary | ICD-10-CM

## 2013-03-30 DIAGNOSIS — O403XX1 Polyhydramnios, third trimester, fetus 1: Secondary | ICD-10-CM

## 2013-03-30 DIAGNOSIS — O10019 Pre-existing essential hypertension complicating pregnancy, unspecified trimester: Secondary | ICD-10-CM

## 2013-03-30 DIAGNOSIS — O409XX0 Polyhydramnios, unspecified trimester, not applicable or unspecified: Secondary | ICD-10-CM

## 2013-03-30 NOTE — Progress Notes (Signed)
1114 NST reviewed and reactive

## 2013-03-30 NOTE — Progress Notes (Signed)
P = 100    IOL scheduled 11/26 @ 0730

## 2013-04-03 ENCOUNTER — Ambulatory Visit (INDEPENDENT_AMBULATORY_CARE_PROVIDER_SITE_OTHER): Payer: Self-pay | Admitting: Obstetrics and Gynecology

## 2013-04-03 VITALS — BP 137/86 | Temp 97.1°F | Wt 223.8 lb

## 2013-04-03 DIAGNOSIS — O10013 Pre-existing essential hypertension complicating pregnancy, third trimester: Secondary | ICD-10-CM

## 2013-04-03 DIAGNOSIS — O10019 Pre-existing essential hypertension complicating pregnancy, unspecified trimester: Secondary | ICD-10-CM

## 2013-04-03 DIAGNOSIS — O409XX0 Polyhydramnios, unspecified trimester, not applicable or unspecified: Secondary | ICD-10-CM

## 2013-04-03 DIAGNOSIS — Z609 Problem related to social environment, unspecified: Secondary | ICD-10-CM

## 2013-04-03 DIAGNOSIS — O368390 Maternal care for abnormalities of the fetal heart rate or rhythm, unspecified trimester, not applicable or unspecified: Secondary | ICD-10-CM

## 2013-04-03 DIAGNOSIS — O99210 Obesity complicating pregnancy, unspecified trimester: Secondary | ICD-10-CM

## 2013-04-03 DIAGNOSIS — E669 Obesity, unspecified: Secondary | ICD-10-CM

## 2013-04-03 DIAGNOSIS — O0992 Supervision of high risk pregnancy, unspecified, second trimester: Secondary | ICD-10-CM

## 2013-04-03 DIAGNOSIS — O403XX1 Polyhydramnios, third trimester, fetus 1: Secondary | ICD-10-CM

## 2013-04-03 DIAGNOSIS — O0993 Supervision of high risk pregnancy, unspecified, third trimester: Secondary | ICD-10-CM

## 2013-04-03 DIAGNOSIS — Z789 Other specified health status: Secondary | ICD-10-CM

## 2013-04-03 LAB — POCT URINALYSIS DIP (DEVICE)
Bilirubin Urine: NEGATIVE
Glucose, UA: NEGATIVE mg/dL
Nitrite: NEGATIVE
Urobilinogen, UA: 0.2 mg/dL (ref 0.0–1.0)
pH: 7 (ref 5.0–8.0)

## 2013-04-03 NOTE — Progress Notes (Signed)
Patient doing well without complaints. FM/labor precautions reviewed. NST reviewed and reactive. Patient scheduled for IOL on 11/26

## 2013-04-03 NOTE — Progress Notes (Signed)
P= 96  Irregular contractions last night.

## 2013-04-03 NOTE — Progress Notes (Signed)
IOL scheduled on 04/11/13

## 2013-04-05 ENCOUNTER — Telehealth (HOSPITAL_COMMUNITY): Payer: Self-pay | Admitting: *Deleted

## 2013-04-05 ENCOUNTER — Encounter (HOSPITAL_COMMUNITY): Payer: Self-pay | Admitting: *Deleted

## 2013-04-05 NOTE — Telephone Encounter (Signed)
Preadmission screen Interpreter number [id number] 

## 2013-04-06 ENCOUNTER — Ambulatory Visit (INDEPENDENT_AMBULATORY_CARE_PROVIDER_SITE_OTHER): Payer: Self-pay | Admitting: *Deleted

## 2013-04-06 VITALS — BP 124/75

## 2013-04-06 DIAGNOSIS — O10013 Pre-existing essential hypertension complicating pregnancy, third trimester: Secondary | ICD-10-CM

## 2013-04-06 DIAGNOSIS — O10019 Pre-existing essential hypertension complicating pregnancy, unspecified trimester: Secondary | ICD-10-CM

## 2013-04-06 NOTE — Progress Notes (Signed)
P = 91 

## 2013-04-06 NOTE — Progress Notes (Signed)
NST reviewed and reactive.  Kha Hari L. Harraway-Smith, M.D., FACOG    

## 2013-04-09 ENCOUNTER — Ambulatory Visit (INDEPENDENT_AMBULATORY_CARE_PROVIDER_SITE_OTHER): Payer: Self-pay | Admitting: Family Medicine

## 2013-04-09 VITALS — BP 127/79 | Temp 98.5°F | Wt 224.9 lb

## 2013-04-09 DIAGNOSIS — E669 Obesity, unspecified: Secondary | ICD-10-CM

## 2013-04-09 DIAGNOSIS — O10019 Pre-existing essential hypertension complicating pregnancy, unspecified trimester: Secondary | ICD-10-CM

## 2013-04-09 DIAGNOSIS — O409XX0 Polyhydramnios, unspecified trimester, not applicable or unspecified: Secondary | ICD-10-CM

## 2013-04-09 DIAGNOSIS — O368390 Maternal care for abnormalities of the fetal heart rate or rhythm, unspecified trimester, not applicable or unspecified: Secondary | ICD-10-CM

## 2013-04-09 DIAGNOSIS — O0992 Supervision of high risk pregnancy, unspecified, second trimester: Secondary | ICD-10-CM

## 2013-04-09 DIAGNOSIS — O99210 Obesity complicating pregnancy, unspecified trimester: Secondary | ICD-10-CM

## 2013-04-09 LAB — POCT URINALYSIS DIP (DEVICE)
Bilirubin Urine: NEGATIVE
Glucose, UA: NEGATIVE mg/dL
Ketones, ur: NEGATIVE mg/dL
Nitrite: NEGATIVE
Protein, ur: NEGATIVE mg/dL
pH: 7 (ref 5.0–8.0)

## 2013-04-09 NOTE — Progress Notes (Signed)
P=87 NST

## 2013-04-09 NOTE — Progress Notes (Signed)
Pulse- 102  Edema-hands, feet, face

## 2013-04-09 NOTE — Progress Notes (Signed)
NST reviewed and reactive. BP up and then improved.--For IOL at 40 wks. PIH precautions reviewed

## 2013-04-09 NOTE — Patient Instructions (Signed)
Tercer trimestre del embarazo  (Third Trimester of Pregnancy) El tercer trimestre del embarazo abarca desde la semana 29 hasta la semana 42, desde el 7 mes hasta el 9. En este trimestre el feto se desarrolla muy rpidamente. Hacia el final del noveno mes, el beb que an no ha nacido mide alrededor de 20 pulgadas (45 cm) de largo y pesa entre 6 y 10 libras (2,700 y 4,500 kg).  CAMBIOS CORPORALES  Su organismo atravesar numerosos cambios durante el embarazo. Los cambios varan de una mujer a otra.   Seguir aumentando de peso. Es esperable que aumente entre 25 y 35 libras (11 16 kg) hacia el final del embarazo.  Podrn aparecer las primeras estras en las caderas, abdomen y mamas.  Tendr necesidad de orinar con ms frecuencia porque el feto baja hacia la pelvis y presiona en la vejiga.  Como consecuencia del embarazo, podr sentir acidez estomacal continuamente.  Podr estar constipada ya que ciertas hormonas hacen que los msculos que hacen progresar los desechos a travs de los intestinos trabajen ms lentamente.  Pueden aparecer hemorroides o abultarse e hincharse las venas (venas varicosas).  Podr sentir dolor plvico debido al aumento de peso ya que las hormonas del embarazo relajan las articulaciones entre los huesos de la pelvis. El dolor de espalda puede ser consecuencia de la exigencia de los msculos que soportan la postura.  Sus mamas seguirn desarrollndose y estarn ms sensibles. A veces sale una secrecin amarilla de las mamas, que se llama calostro.  El ombligo puede salir hacia afuera.  Podr sentir que le falta el aire debido a que se expande el tero.  Podr notar que el feto "baja" o que se siente ms bajo en el abdomen.  Podr tener una prdida de secrecin mucosa con sangre. Esto suele ocurrir entre unos pocos das y una semana antes del parto.  El cuello se vuelve delgado y blando (se borra) cerca de la fecha de parto. QU DEBE ESPERAR EN LAS CONSULTAS  PRENATALES  Le harn exmenes prenatales cada 2 semanas hasta la semana 36. A partir de ese momento le harn exmenes semanales. Durante una visita prenatal de rutina:   La pesarn para verificar que usted y el feto se encuentran dentro de los lmites normales.  Le tomarn la presin arterial.  Le medirn el abdomen para verificar el desarrollo del beb.  Escucharn los latidos fetales.  Se evaluarn los resultados de los estudios solicitados en visitas anteriores.  Le controlarn el cuello del tero cuando est prxima la fecha de parto para ver si se ha borrado. Alrededor de la semana 36 el mdico controlar el cuello del tero. Al mismo tiempo realizar un anlisis de las secreciones del tejido vaginal. Este examen es para determinar si hay un tipo de bacteria, estreptococo Grupo B. El mdico le explicar esto con ms detalle.  El mdico podr preguntarle:   Como le gustara que fuera el parto.  Cmo se siente.  Si siente los movimientos del beb.  Si tiene sntomas anormales, como prdida de lquido, sangrado, dolores de cabeza intenso o clicos abdominales.  Si tiene alguna duda. Otros estudios que podrn realizarse durante el tercer trimestre son:   Anlisis de sangre para controlar sus niveles de hierro (anemia).  Controles fetales para determinar su salud, el nivel de actividad y su desarrollo. Si tiene alguna enfermedad o si tuvo problemas durante el embarazo, le harn estudios. FALSO TRABAJO DE PARTO  Es posible que sienta contracciones pequeas e irregulares que finalmente   desaparecen. Se llaman contracciones de Braxton Hicks o falso trabajo de parto. Las contracciones pueden durar horas, das o an semanas antes de que el verdadero trabajo de parto se inicie. Si las contracciones tienen intervalos regulares, se intensifican o se hacen dolorosas, lo mejor es que la revise su mdico.  SIGNOS DE TRABAJO DE PARTO   Espasmos del tipo menstrual.  Contracciones cada 5  minutos o menos.  Contracciones que comienzan en la parte superior del tero y se expanden hacia abajo, a la zona inferior del abdomen y la espalda.  Sensacin de presin que aumenta en la pelvis o dolor en la espalda.  Aparece una secrecin acuosa o sanguinolenta por la vagina. Si tiene alguno de estos signos antes de la semana 37 del embarazo, llame a su mdico inmediatamente. Debe concurrir al hospital para ser controlada inmediatamente.  INSTRUCCIONES PARA EL CUIDADO EN EL HOGAR   Evite fumar, consumir hierbas, beber alcohol y utilizar frmacos que no le hayan recetado. Estas sustancias qumicas afectan la formacin y el desarrollo del beb.  Siga las indicaciones del profesional con respecto a como tomar los medicamentos. Durante el embarazo, hay medicamentos que son seguros y otros no lo son.  Realice actividad fsica slo segn las indicaciones del mdico. Sentir clicos uterinos es el mejor signo para detener la actividad fsica.  Contine haciendo comidas regulares y sanas.  Use un sostn que le brinde buen soporte si sus mamas estn sensibles.  No utilice la baera con agua caliente, baos turcos o saunas.  Colquese el cinturn de seguridad cuando conduzca.  Evite comer carne cruda queso sin cocinar y el contacto con los utensilios y desperdicios de los gatos. Estos elementos contienen grmenes que pueden causar defectos de nacimiento en el beb.  Tome las vitaminas indicadas para la etapa prenatal.  Pruebe un laxante (si el mdico la autoriza) si tiene constipacin. Consuma ms alimentos ricos en fibra, como vegetales y frutas frescos y cereales enteros. Beba gran cantidad de lquido para mantener la orina de tono claro o amarillo plido.  Tome baos de agua tibia para calmar el dolor o las molestias causadas por las hemorroides. Use una crema para las hemorroides si el mdico la autoriza.  Si tiene venas varicosas, use medias de soporte. Eleve los pies durante 15 minutos,  3 4 veces por da. Limite el consumo de sal en su dieta.  Evite levantar objetos pesados, use zapatos de tacones bajos y mantenga una buena postura.  Descanse con las piernas elevadas si tiene calambres o dolor de cintura.  Visite a su dentista si no lo ha hecho durante el embarazo. Use un cepillo de dientes blando para higienizarse los dientes y use suavemente el hilo dental.  Puede continuar su vida sexual excepto que el mdico le indique otra cosa.  No haga viajes largos excepto que sea absolutamente necesario y slo con la aprobacin de su mdico.  Tome clases prenatales para entender, practicar y hacer preguntas sobre el trabajo de parto y el alumbramiento.  Haga un ensayo sobre la partida al hospital.  Prepare el bolso que llevar al hospital.  Prepare la habitacin del beb.  Contine concurriendo a todas las visitas prenatales segn las indicaciones de su mdico. SOLICITE ATENCIN MDICA SI:   No est segura si est en trabajo de parto o ha roto la bolsa de aguas.  Tiene mareos.  Siente clicos leves, presin en la pelvis o dolor persistente en el abdomen.  Tiene nuseas o vmitos persistentes.  Observa una   secrecin vaginal con mal olor.  Siente dolor al orinar. SOLICITE ATENCIN MDICA DE INMEDIATO SI:   Tiene fiebre.  Pierde lquido o sangre por la vagina.  Tiene sangrado o pequeas prdidas vaginales.  Siente dolor intenso o clicos en el abdomen.  Sube o baja de peso rpidamente.  Le falta el aire y le duele el pecho al respirar.  Sbitamente se le hincha el rostro, las manos, los tobillos, los pies o las piernas de manera extrema.  No ha sentido los movimientos del beb durante una hora.  Siente un dolor de cabeza intenso que no se alivia con medicamentos.  Su visin se modifica. Document Released: 02/10/2005 Document Revised: 01/03/2013 [employer] Patient Information 2014 [employer].  Lactancia materna  (Breastfeeding)  El cambio hormonal  durante el embarazo produce el desarrollo del tejido mamario y un aumento en el nmero y tamao de los conductos galactforos. La hormona prolactina permite que las protenas, los azcares y las grasas de la sangre produzcan la leche materna en las glndulas productoras de leche. La hormona progesterona impide que la leche materna sea liberada antes del nacimiento del beb. Despus del nacimiento del beb, su nivel de progesterona disminuye permitiendo que la leche materna sea liberada. Pensar en el beb, as como la succin o el llanto, pueden estimular la liberacin de leche de las glndulas productoras de leche.  La decisin de amamantar (lactar) es una de las mejores opciones que usted puede hacer para usted y su beb. La informacin que sigue da una breve resea de los beneficios, as como otras caractersticas importantes que debe saber sobre la lactancia materna.  LOS BENEFICIOS DE AMAMANTAR  Para el beb   La primera leche (calostro) ayuda al mejor funcionamiento del sistema digestivo del beb.   La leche tiene anticuerpos que provienen de la madre y que ayudan a prevenir las infecciones en el beb.   El beb tiene una menor incidencia de asma, alergias y del sndrome de muerte sbita del lactante (SMSL).   Los nutrientes de la leche materna son mejores para el beb que la leche maternizada.  La leche materna mejora el desarrollo cerebral del beb.   Su beb tendr menos gases, clicos y estreimiento.  Es menos probable que el beb desarrolle otras enfermedades, como obesidad infantil, asma o diabetes mellitus. Para usted   La lactancia materna favorece el desarrollo de un vnculo muy especial entre la madre y el beb.   Es ms conveniente, siempre disponible, a la temperatura adecuada y econmica.   La lactancia materna ayuda a quemar caloras y a perder el peso ganado durante el embarazo.   Hace que el tero se contraiga ms rpidamente a su tamao normal y disminuye el  sangrado despus del parto.   Las madres que amamantan tienen menos riesgo de desarrollar osteoporosis o cncer de mama o de ovario en el futuro.  FRECUENCIA DEL AMAMANTAMIENTO   Un beb sano, nacido a trmino, puede amamantarse con tanta frecuencia como cada hora, o espaciar las comidas cada tres horas. La frecuencia en la lactancia varan de un beb a otro.   Los recin nacidos deben ser alimentados por lo menos cada 2-3 horas durante el da y cada 4-5 horas durante la noche. Usted debe amamantarlo un mnimo de 8 tomas en un perodo de 24 horas.  Despierte al beb para amamantarlo si han pasado 3-4 horas desde la ltima comida.  Amamante cuando sienta la necesidad de reducir la plenitud de sus senos o cuando el   beb muestre signos de hambre. Las seales de que el beb puede tener hambre son:  Aumenta su estado de alerta o vigilancia.  Se estira.  Mueve la cabeza de un lado a otro.  Mueve la cabeza y abre la boca cuando se le toca la mejilla o la boca (reflejo de succin).  Aumenta las vocalizaciones, tales como sonidos de succin, relamerse los labios, arrullos, suspiros, o chirridos.  Mueve la mano hacia la boca.  Se chupa con ganas los dedos o las manos.  Agitacin.  Llanto intermitente.  Los signos de hambre extrema requerirn que lo calme y lo consuele antes de tratar de alimentarlo. Los signos de hambre extrema son:  Agitacin.  Llanto fuerte e intenso.  Gritos.  El amamantamiento frecuente la ayudar a producir ms leche y a prevenir problemas de dolor en los pezones e hinchazn de las mamas.  LACTANCIA MATERNA   Ya sea que se encuentre acostada o sentada, asegrese que el abdomen del beb est enfrente el suyo.   Sostenga la mama con el pulgar por arriba y los otros 4 dedos por debajo del pezn. Asegrese que sus dedos se encuentren lejos del pezn y de la boca del beb.   Empuje suavemente los labios del beb con el pezn o con el dedo.   Cuando la  boca del beb se abra lo suficiente, introduzca el pezn y la zona oscura que lo rodea (areola) tanto como le sea posible dentro de la boca.  Debe haber ms areola visible por arriba del labio superior que por debajo del labio inferior.  La lengua del beb debe estar entre la enca inferior y el seno.  Asegrese de que la boca del beb est en la posicin correcta alrededor del pezn (prendida). Los labios del beb deben crear un sello sobre su pecho.  Las seales de que el beb se ha prendido eficazmente al pezn son:  Tironea o succiona sin dolor.  Se escucha que traga entre las succiones.  No hace ruidos ni chasquidos.  Hay movimientos musculares por arriba y por delante de sus odos al succionar.  El beb debe succionar unos 2-3 minutos para que salga la leche. Permita que el nio se alimente en cada mama todo lo que desee. Alimente al beb hasta que se desprenda o se quede dormido en el primer pecho y luego ofrzcale el segundo pecho.  Las seales de que el beb est lleno y satisfecho son:  Disminuye gradualmente el nmero de succiones o no succiona.  Se queda dormido.  Extiende o relaja su cuerpo.  Retiene una pequea cantidad de leche en la boca.  Se desprende del pecho por s mismo.  Los signos de una lactancia materna eficaz son:  Los senos han aumentado la firmeza, el peso y el tamao antes de la alimentacin.  Son ms blandos despus de amamantar.  Un aumento del volumen de leche, y tambin el cambio de su consistencia y color se producen hacia el quinto da de lactancia materna.  La congestin mamaria se alivia al dar de mamar.  Los pezones no duelen, ni estn agrietados ni sangran.  De ser necesario, interrumpa la succin poniendo su dedo en la esquina de la boca del beb y deslizando el dedo entre sus encas. A continuacin, retire la mama de su boca.  Es comn que los bebs regurgiten un poco despus de comer.  A menudo los bebs tragan aire al  alimentarse. Esto puede hacer que se sienta molesto. Hacer eructar al   beb al cambiar de pecho puede ser de ayuda.  Se recomiendan suplementos de vitamina D para los bebs que reciben slo leche materna.  Evite el uso del chupete durante las primeras 4 a 6 semanas de vida.  Evite la alimentacin suplementaria con agua, frmula o jugo en lugar de la leche materna. La leche materna es todo el alimento que el beb necesita. No es necesario que el nio ingiera agua o preparados de bibern. Sus pechos producirn ms leche si se evita la alimentacin suplementaria durante las primeras semanas. COMO SABER SI EL BEB OBTIENE LA SUFICIENTE LECHE MATERNA  Preguntarse si el beb obtiene la cantidad suficiente de leche es una preocupacin frecuente entre las madres. Puede asegurarse que el beb tiene la leche suficiente si:   El beb succiona activamente y usted escucha que traga.   El beb parece estar relajado y satisfecho despus de mamar.   El nio se alimenta al menos 8 a 12 veces en 24 horas.  Durante los primeros 3 a 5 das de vida:  Moja 3-5 paales en 24 horas. La materia fecal debe ser blanda y amarillenta.  Tiene al menos 3 a 4 deposiciones en 24 horas. La materia fecal debe ser blanda y amarillenta.  A los 5-7 das de vida, el beb debe tener al menos 3-6 deposiciones en 24 horas. La materia fecal debe ser grumosa y amarilla a los 5 das de vida.  Su beb tiene una prdida de peso menor a 7al 10% durante los primeros 3 das de vida.  El beb no pierde peso despus de 3-7 das de vida.  El beb debe aumentar 4 a 6 libras (120 a 170 gr.) por semana despus de los 4 das de vida.  Aumenta de peso a los 5 das de vida y vuelve al peso del nacimiento dentro de las 2 semanas. CONGESTIN MAMARIA  Durante la primera semana despus del parto, usted puede experimentar hinchazn en las mamas (congestin mamaria). Al estar congestionadas, las mamas se sienten pesadas, calientes o sensibles al  tacto. El pico de la congestin ocurre a las 24 -48 horas despus del parto.   La congestin puede disminuirse:  Continuando con la lactancia materna.  Aumentando la frecuencia.  Tomando duchas calientes o aplicando calor hmedo en los senos antes de cada comida. Esto aumenta la circulacin y ayuda a que la leche fluya.   Masajeando suavemente el pecho antes y durante las comidas. Con las yemas de los dedos, masajee la pared del pecho hacia el pezn en un movimiento circular.   Asegurarse de que el beb vaca al menos uno de sus pechos en cada alimentacin. Tambin ayuda si comienza la siguiente toma en el otro seno.   Extraiga manualmente o con un sacaleches las mamas para vaciar los pechos si el beb tiene sueo o no se aliment bien. Tambin puede extraer la leche cuando vuelva a trabajar o si siente que se estn congestionando las mamas.  Asegrese de que el beb se prende y est bien colocado durante la lactancia. Si sigue estas indicaciones, la congestin debe mejorar en 24 a 48 horas. Si an tiene dificultades, consulte a su asesor en lactancia.  CUDESE USTED MISMA  Cuide sus mamas.   Bese o dchese diariamente.   Evite usar jabn en los pezones.   Use un sostn de soporte Evite el uso de sostenes con aro.  Seque al aire sus pezones durante 3-4 minutos despus de cada comida.   Utilice slo apsitos de   algodn en el sostn para absorber las prdidas de leche. La prdida de un poco de leche materna entre las comidas es normal.   Use solamente lanolina pura en sus pezones despus de amamantar. Usted no tiene que lavarla antes de alimentar al beb. Otra opcin es sacarse unas gotas de leche y masajear suavemente los pezones.  Continuar con los autocontroles de la mama. Cudese.   Consuma alimentos saludables. Alterne 3 comidas con 3 colaciones.  Evite los alimentos que usted nota que perjudican al beb.  Beba leche, jugos de fruta y agua para satisfacer su sed  (aproximadamente 8 vasos al da).   Descanse con frecuencia, reljese y tome sus vitaminas prenatales para evitar la fatiga, el estrs y la anemia.  Evite masticar y fumar tabaco.  Evite el consumo de alcohol y drogas.  Tome medicamentos de venta libre y recetados tal como le indic su mdico o farmacutico. Siempre debe consultar con su mdico o farmacutico antes de tomar cualquier medicamento, vitamina o suplemento de hierbas.  Sepa que durante la lactancia puede quedar embarazada. Si lo desea, hable con su mdico acerca de la planificacin familiar y los mtodos anticonceptivos seguros que puede utilizar durante la lactancia. SOLICITE ATENCIN MDICA SI:   Usted siente que quiere dejar de amamantar o se siente frustrada con la lactancia.  Siente dolor en los senos o en los pezones.  Sus pezones estn agrietados o sangran.  Sus pechos estn irritados, sensibles o calientes.  Tiene un rea hinchada en cualquiera de los senos.  Siente escalofros o fiebre.  Tiene nuseas o vmitos.  Observa un drenaje en los pezones.  Sus mamas no se llenan antes de amamantarlo al 5to da despus del parto.  Se siente triste y deprimida.  El nio est demasiado somnoliento como para comer.  El nio tiene problemas para respirar.   Moja menos de 3 paales en 24 horas.  Mueve el intestino menos de 3 veces en 24 horas.  La piel del beb o la parte blanca de sus ojos est ms amarilla.   El beb no ha aumentado de peso a los 5 das de vida. ASEGRESE DE QUE:   Comprende estas instrucciones.  Controlar su enfermedad.  Solicitar ayuda de inmediato si no mejora o si empeora. Document Released: 05/03/2005 Document Revised: 01/26/2012 [employer] Patient Information 2014 [employer].  

## 2013-04-11 ENCOUNTER — Encounter (HOSPITAL_COMMUNITY): Payer: Self-pay

## 2013-04-11 ENCOUNTER — Inpatient Hospital Stay (HOSPITAL_COMMUNITY)
Admission: RE | Admit: 2013-04-11 | Discharge: 2013-04-13 | DRG: 774 | Disposition: A | Discharge status: Home / Self Care | Payer: Medicaid Other | Source: Ambulatory Visit | Attending: Obstetrics & Gynecology | Admitting: Obstetrics & Gynecology

## 2013-04-11 ENCOUNTER — Encounter (HOSPITAL_COMMUNITY): Payer: Medicaid Other | Admitting: Anesthesiology

## 2013-04-11 ENCOUNTER — Inpatient Hospital Stay (HOSPITAL_COMMUNITY): Payer: Medicaid Other | Admitting: Anesthesiology

## 2013-04-11 ENCOUNTER — Inpatient Hospital Stay (HOSPITAL_COMMUNITY)
Admission: RE | Admit: 2013-04-11 | Discharge: 2013-04-11 | Disposition: A | Discharge status: Home / Self Care | Payer: Medicaid Other | Source: Ambulatory Visit | Attending: Obstetrics & Gynecology | Admitting: Obstetrics & Gynecology

## 2013-04-11 DIAGNOSIS — O358XX0 Maternal care for other (suspected) fetal abnormality and damage, not applicable or unspecified: Secondary | ICD-10-CM | POA: Diagnosis present

## 2013-04-11 DIAGNOSIS — O1002 Pre-existing essential hypertension complicating childbirth: Principal | ICD-10-CM | POA: Diagnosis present

## 2013-04-11 DIAGNOSIS — O409XX0 Polyhydramnios, unspecified trimester, not applicable or unspecified: Secondary | ICD-10-CM | POA: Diagnosis present

## 2013-04-11 DIAGNOSIS — O360990 Maternal care for other rhesus isoimmunization, unspecified trimester, not applicable or unspecified: Secondary | ICD-10-CM | POA: Diagnosis present

## 2013-04-11 DIAGNOSIS — O10013 Pre-existing essential hypertension complicating pregnancy, third trimester: Secondary | ICD-10-CM

## 2013-04-11 LAB — CBC
HCT: 35.3 % — ABNORMAL LOW (ref 36.0–46.0)
Hemoglobin: 12.6 g/dL (ref 12.0–15.0)
Hemoglobin: 12.3 g/dL (ref 12.0–15.0)
Hemoglobin: 12.2 g/dL (ref 12.0–15.0)
MCH: 29.7 pg (ref 26.0–34.0)
MCH: 29.8 pg (ref 26.0–34.0)
MCHC: 34.6 g/dL (ref 30.0–36.0)
MCHC: 34.6 g/dL (ref 30.0–36.0)
MCHC: 34.6 g/dL (ref 30.0–36.0)
MCV: 86.1 fL (ref 78.0–100.0)
MCV: 86.3 fL (ref 78.0–100.0)
Platelets: 238 10*3/uL (ref 150–400)
Platelets: 208 10*3/uL (ref 150–400)
RBC: 4.14 MIL/uL (ref 3.87–5.11)
RBC: 4.09 MIL/uL (ref 3.87–5.11)
RDW: 13.7 % (ref 11.5–15.5)
RDW: 13.7 % (ref 11.5–15.5)
RDW: 13.6 % (ref 11.5–15.5)
WBC: 12 10*3/uL — ABNORMAL HIGH (ref 4.0–10.5)

## 2013-04-11 LAB — TYPE AND SCREEN: ABO/RH(D): O NEG

## 2013-04-11 LAB — RPR: RPR Ser Ql: NONREACTIVE

## 2013-04-11 LAB — PROTEIN / CREATININE RATIO, URINE
Creatinine, Urine: 25.96 mg/dL
Protein Creatinine Ratio: 0.18 — ABNORMAL HIGH (ref 0.00–0.15)
Total Protein, Urine: 4.7 mg/dL

## 2013-04-11 LAB — COMPREHENSIVE METABOLIC PANEL
AST: 17 U/L (ref 0–37)
Albumin: 3.3 g/dL — ABNORMAL LOW (ref 3.5–5.2)
BUN: 9 mg/dL (ref 6–23)
Calcium: 10.1 mg/dL (ref 8.4–10.5)
Chloride: 101 mEq/L (ref 96–112)
Creatinine, Ser: 0.51 mg/dL (ref 0.50–1.10)
GFR calc Af Amer: >90 mL/min (ref >90)
Total Bilirubin: 0.1 mg/dL — ABNORMAL LOW (ref 0.3–1.2)

## 2013-04-11 MED ORDER — ONDANSETRON HCL 4 MG PO TABS: 4.0000 mg | ORAL_TABLET | ORAL | Status: DC | PRN

## 2013-04-11 MED ORDER — PHENYLEPHRINE 40 MCG/ML (10ML) SYRINGE FOR IV PUSH (FOR BLOOD PRESSURE SUPPORT)
80.0000 ug | PREFILLED_SYRINGE | INTRAVENOUS | Status: DC | PRN
  Filled 2013-04-11: qty 2

## 2013-04-11 MED ORDER — EPHEDRINE 5 MG/ML INJ
10.0000 mg | INTRAVENOUS | Status: DC | PRN
  Filled 2013-04-11: qty 2
  Filled 2013-04-11: qty 4

## 2013-04-11 MED ORDER — ACETAMINOPHEN 325 MG PO TABS: 650.0000 mg | ORAL_TABLET | ORAL | Status: DC | PRN

## 2013-04-11 MED ORDER — LANOLIN HYDROUS EX OINT: TOPICAL_OINTMENT | CUTANEOUS | Status: DC | PRN

## 2013-04-11 MED ORDER — DIPHENHYDRAMINE HCL 50 MG/ML IJ SOLN: 12.5000 mg | INTRAMUSCULAR | Status: DC | PRN

## 2013-04-11 MED ORDER — DIPHENHYDRAMINE HCL 25 MG PO CAPS: 25.0000 mg | ORAL_CAPSULE | Freq: Four times a day (QID) | ORAL | Status: DC | PRN

## 2013-04-11 MED ORDER — ONDANSETRON HCL 4 MG/2ML IJ SOLN: 4.0000 mg | Freq: Four times a day (QID) | INTRAMUSCULAR | Status: DC | PRN

## 2013-04-11 MED ORDER — FENTANYL 2.5 MCG/ML BUPIVACAINE 1/10 % EPIDURAL INFUSION (WH - ANES)
14.0000 mL/h | INTRAMUSCULAR | Status: DC | PRN
  Administered 2013-04-11: 14 mL/h via EPIDURAL
  Filled 2013-04-11: qty 125

## 2013-04-11 MED ORDER — LACTATED RINGERS IV SOLN
INTRAVENOUS | Status: DC
  Administered 2013-04-11 (×3): via INTRAVENOUS

## 2013-04-11 MED ORDER — LACTATED RINGERS IV SOLN
500.0000 mL | Freq: Once | INTRAVENOUS | Status: AC
  Administered 2013-04-11: 500 mL via INTRAVENOUS

## 2013-04-11 MED ORDER — OXYCODONE-ACETAMINOPHEN 5-325 MG PO TABS: 1.0000 | ORAL_TABLET | ORAL | Status: DC | PRN

## 2013-04-11 MED ORDER — ONDANSETRON HCL 4 MG/2ML IJ SOLN: 4.0000 mg | INTRAMUSCULAR | Status: DC | PRN

## 2013-04-11 MED ORDER — TETANUS-DIPHTH-ACELL PERTUSSIS 5-2.5-18.5 LF-MCG/0.5 IM SUSP
0.5000 mL | Freq: Once | INTRAMUSCULAR | Status: AC
  Administered 2013-04-13: 0.5 mL via INTRAMUSCULAR
  Filled 2013-04-11: qty 0.5

## 2013-04-11 MED ORDER — LIDOCAINE HCL (PF) 1 % IJ SOLN
30.0000 mL | INTRAMUSCULAR | Status: DC | PRN
  Filled 2013-04-11 (×2): qty 30

## 2013-04-11 MED ORDER — OXYTOCIN 40 UNITS IN LACTATED RINGERS INFUSION - SIMPLE MED: 62.5000 mL/h | INTRAVENOUS | Status: DC

## 2013-04-11 MED ORDER — SENNOSIDES-DOCUSATE SODIUM 8.6-50 MG PO TABS
2.0000 | ORAL_TABLET | ORAL | Status: DC
  Administered 2013-04-12 (×2): 2 via ORAL
  Filled 2013-04-11 (×2): qty 2

## 2013-04-11 MED ORDER — OXYTOCIN 40 UNITS IN LACTATED RINGERS INFUSION - SIMPLE MED
1.0000 m[IU]/min | INTRAVENOUS | Status: DC
  Administered 2013-04-11: 2 m[IU]/min via INTRAVENOUS
  Filled 2013-04-11: qty 1000

## 2013-04-11 MED ORDER — SIMETHICONE 80 MG PO CHEW: 80.0000 mg | CHEWABLE_TABLET | ORAL | Status: DC | PRN

## 2013-04-11 MED ORDER — CITRIC ACID-SODIUM CITRATE 334-500 MG/5ML PO SOLN: 30.0000 mL | ORAL | Status: DC | PRN

## 2013-04-11 MED ORDER — FLEET ENEMA 7-19 GM/118ML RE ENEM: 1.0000 | ENEMA | RECTAL | Status: DC | PRN

## 2013-04-11 MED ORDER — BENZOCAINE-MENTHOL 20-0.5 % EX AERO
1.0000 "application " | INHALATION_SPRAY | CUTANEOUS | Status: DC | PRN
  Administered 2013-04-12: 1 via TOPICAL
  Filled 2013-04-11: qty 56

## 2013-04-11 MED ORDER — PHENYLEPHRINE 40 MCG/ML (10ML) SYRINGE FOR IV PUSH (FOR BLOOD PRESSURE SUPPORT)
80.0000 ug | PREFILLED_SYRINGE | INTRAVENOUS | Status: DC | PRN
  Filled 2013-04-11: qty 2
  Filled 2013-04-11: qty 10

## 2013-04-11 MED ORDER — WITCH HAZEL-GLYCERIN EX PADS: 1.0000 "application " | MEDICATED_PAD | CUTANEOUS | Status: DC | PRN

## 2013-04-11 MED ORDER — ZOLPIDEM TARTRATE 5 MG PO TABS: 5.0000 mg | ORAL_TABLET | Freq: Every evening | ORAL | Status: DC | PRN

## 2013-04-11 MED ORDER — PRENATAL MULTIVITAMIN CH
1.0000 | ORAL_TABLET | Freq: Every day | ORAL | Status: DC
  Administered 2013-04-12: 1 via ORAL
  Filled 2013-04-11: qty 1

## 2013-04-11 MED ORDER — LACTATED RINGERS IV SOLN: 500.0000 mL | INTRAVENOUS | Status: DC | PRN

## 2013-04-11 MED ORDER — EPHEDRINE 5 MG/ML INJ
10.0000 mg | INTRAVENOUS | Status: DC | PRN
  Filled 2013-04-11: qty 2

## 2013-04-11 MED ORDER — DIBUCAINE 1 % RE OINT: 1.0000 "application " | TOPICAL_OINTMENT | RECTAL | Status: DC | PRN

## 2013-04-11 MED ORDER — OXYTOCIN BOLUS FROM INFUSION
500.0000 mL | INTRAVENOUS | Status: DC
  Administered 2013-04-11: 500 mL via INTRAVENOUS

## 2013-04-11 MED ORDER — IBUPROFEN 600 MG PO TABS: 600.0000 mg | ORAL_TABLET | Freq: Four times a day (QID) | ORAL | Status: DC | PRN

## 2013-04-11 MED ORDER — IBUPROFEN 600 MG PO TABS
600.0000 mg | ORAL_TABLET | Freq: Four times a day (QID) | ORAL | Status: DC
  Administered 2013-04-12 – 2013-04-13 (×6): 600 mg via ORAL
  Filled 2013-04-11 (×6): qty 1

## 2013-04-11 MED ORDER — TERBUTALINE SULFATE 1 MG/ML IJ SOLN: 0.2500 mg | Freq: Once | INTRAMUSCULAR | Status: DC | PRN

## 2013-04-11 MED ORDER — LIDOCAINE HCL (PF) 1 % IJ SOLN
INTRAMUSCULAR | Status: DC | PRN
  Administered 2013-04-11 (×4): 4 mL

## 2013-04-11 NOTE — H&P (Signed)
Attestation of Attending Supervision of Fellow: Evaluation and management procedures were performed by the Fellow under my supervision and collaboration.  I have reviewed the Fellow's note and chart, and I agree with the management and plan.    

## 2013-04-11 NOTE — Progress Notes (Signed)
Markiya Vaniah Chambers is a 32 y.o. G2P1001 at [redacted]w[redacted]d admitted for induction of labor due to Hypertension.  Subjective:  Pt just had a large gush of clear fluid. More uncomfortable with contractions. +FM.   Objective: BP 142/88  Pulse 103  Temp(Src) 98.4 F (36.9 C) (Oral)  Resp 20  Ht 5\' 4"  (1.626 m)  Wt 101.606 kg (224 lb)  BMI 38.43 kg/m2  SpO2 100%  LMP 07/13/2012      FHT:  FHR: 130 bpm, variability: moderate,  accelerations:  Present,  decelerations:  Absent UC:   regular, every 2 minutes SVE:   Dilation: 4 Effacement (%): 70 Station: -2 Exam by:: Renaldo Harrison, RN  Labs: Lab Results  Component Value Date   WBC 8.0 04/11/2013   HGB 12.6 04/11/2013   HCT 36.4 04/11/2013   MCV 86.1 04/11/2013   PLT 238 04/11/2013    Assessment / Plan: IOL for cHTN on pitocin  Labor: Progressing normally Fetal Wellbeing:  Category I Pain Control:  Labor support without medications I/D:  n/a Anticipated MOD:  NSVD  Jennfier Abdulla L 04/11/2013, 6:02 PM

## 2013-04-11 NOTE — H&P (Signed)
Kari Thomas is a 32 y.o. female G2P1001 at [redacted]w[redacted]d by 16w U/S presenting for IOL for chronic HTN not requiring medications.   She denies any recent changes in her normal state of health. Reports good fetal movement, no LOF or VB, and feels irregular sparse mild contractions. She breast fed her first child and desires to breast feed this child. Plans on condoms for postpartum contraception  She received PNC at [facility] beginning at 14w. Pregnancy significant for polyhydramnios and fetal pyelectasis on U/S 10/22, but AFI and fetal renal pelvises were normal by 10/30 U/S.  History OB History   Grav Para Term Preterm Abortions TAB SAB Ect Mult Living   2 1 1       1      G1: Pre-eclampsia and induction at [redacted]w[redacted]d  Past Medical History  Diagnosis Date  . Hypertension   . Gestational diabetes 2014    pt states she is unaware of gestational diabetes   History reviewed. No pertinent past surgical history. Family History: family history includes Cancer in her father; Diabetes in her mother; Heart disease in her mother. Social History:  reports that she has never smoked. She has never used smokeless tobacco. She reports that she does not drink alcohol or use illicit drugs.   Prenatal Transfer Tool  Maternal Diabetes: No 1st trimester: wnl; 2nd: 101 Genetic Screening: Normal Maternal Ultrasounds/Referrals: Normal Fetal Ultrasounds or other Referrals:  None Maternal Substance Abuse:  No Significant Maternal Medications:  None Significant Maternal Lab Results:  Lab values include: Group B Strep negative, Rh negative Other Comments:  Chronic HTN; Had polyhydramnios and left fetal pyelectasis on U/S 10/22, resolved on repeat 10/30.   ROS  Dilation: 3 Effacement (%): 30 Station: -3 Exam by:: Montez Hageman, RN  Bishop score 6  Blood pressure 139/87, pulse 93, temperature 97.5 F (36.4 C), temperature source Oral, resp. rate 20, height 5\' 4"  (1.626 m), weight 101.606 kg (224 lb), last menstrual  period 07/13/2012. Maternal Exam:  Introitus: Vulva is negative for lesion.  Vagina is negative for discharge.    Physical Exam  Constitutional: She is oriented to person, place, and time. She appears well-developed and well-nourished.  HENT:  Head: Normocephalic.  Mouth/Throat: Oropharynx is clear and moist.  Eyes: EOM are normal. Pupils are equal, round, and reactive to light.  Neck: Normal range of motion. Neck supple.  Cardiovascular: Normal rate, regular rhythm and intact distal pulses.   No murmur heard. Respiratory: Effort normal and breath sounds normal. No respiratory distress.  GI: Soft. There is no tenderness.  Genitourinary: Vulva exhibits no lesion. No vaginal discharge found.  Musculoskeletal: Normal range of motion. Edema: trace pitting edema to lower shin B/L.  Neurological: She is alert and oriented to person, place, and time.  Skin: Skin is warm and dry.   FHT: 130 baseline, 15 x 15 accels present, no decels Toco: No appreciable contractions  Prenatal labs: ABO, Rh:  O, negative Antibody:   Rubella:   RPR: NON REAC (09/08 1116)  HBsAg:   Neg HIV: NON REACTIVE (09/08 1116)  GBS: Negative (11/10 0000)   Assessment/Plan: Kari Thomas is a 32 y.o. female G2P1001 at [redacted]w[redacted]d by 16w U/S presenting for IOL for chronic HTN  IOL: Bishop score 6 - Pitocin induction 54mu/min, increase by 6mu/min - Epidural upon request - Lactation consult  CHTN:  - Monitor BP (139/87 on admission) - PIH labs  Rh neg.: - RhoGAM 24-48hrs postpartum  FWB:  - FHTs category I  Anticipate NSVD  Hazeline Junker 04/11/2013, 9:22 AM   I have seen and examined this patient and agree with above documentation in the resident's note.   Rulon Abide, M.D. [facility] Fellow 04/11/2013 10:56 AM

## 2013-04-11 NOTE — Anesthesia Preprocedure Evaluation (Signed)
Anesthesia Evaluation  Patient identified by MRN, date of birth, ID band Patient awake    Reviewed: Allergy & Precautions, H&P , NPO status , Patient's Chart, lab work & pertinent test results, reviewed documented beta blocker date and time   History of Anesthesia Complications Negative for: history of anesthetic complications  Airway Mallampati: III TM Distance: >3 FB Neck ROM: full    Dental  (+) Teeth Intact   Pulmonary neg pulmonary ROS,  breath sounds clear to auscultation        Cardiovascular hypertension (CHTN), Rhythm:regular Rate:Normal     Neuro/Psych negative neurological ROS  negative psych ROS   GI/Hepatic negative GI ROS, Neg liver ROS,   Endo/Other  neg diabetesMorbid obesity  Renal/GU negative Renal ROS     Musculoskeletal   Abdominal   Peds  Hematology negative hematology ROS (+)   Anesthesia Other Findings   Reproductive/Obstetrics (+) Pregnancy                           Anesthesia Physical Anesthesia Plan  ASA: III  Anesthesia Plan: Epidural   Post-op Pain Management:    Induction:   Airway Management Planned:   Additional Equipment:   Intra-op Plan:   Post-operative Plan:   Informed Consent: I have reviewed the patients History and Physical, chart, labs and discussed the procedure including the risks, benefits and alternatives for the proposed anesthesia with the patient or authorized representative who has indicated his/her understanding and acceptance.     Plan Discussed with:   Anesthesia Plan Comments:         Anesthesia Quick Evaluation

## 2013-04-11 NOTE — Progress Notes (Signed)
Kari Thomas is a 32 y.o. G2P1001 at [redacted]w[redacted]d   Subjective: Still feeling ctx with epidural, but mostly comfortable  Objective: BP 139/88  Pulse 96  Temp(Src) 98.8 F (37.1 C) (Oral)  Resp 20  Ht 5\' 4"  (1.626 m)  Wt 101.606 kg (224 lb)  BMI 38.43 kg/m2  SpO2 98%  LMP 07/13/2012      FHT:  FHR: 120-130 bpm, variability: moderate,  accelerations:  Present,  decelerations:  Absent- early variables with ctx UC:   regular, every 3 minutes with Pit at 29mu/min SVE:   Dilation: 7 Effacement (%): 90 Station: -1;0 Exam by:: ansah-mensah, rnc- exam deferred  Labs: Lab Results  Component Value Date   WBC 10.0 04/11/2013   HGB 12.3 04/11/2013   HCT 35.5* 04/11/2013   MCV 85.7 04/11/2013   PLT 208 04/11/2013    Assessment / Plan: Active labor CHTN- stable BP  Will reexamine cx in 1-2 hrs or with urge to push  SHAW, [facility] 04/11/2013, 8:50 PM

## 2013-04-11 NOTE — Anesthesia Procedure Notes (Signed)
Epidural Patient location during procedure: OB Start time: 04/11/2013 6:53 PM  Staffing Performed by: anesthesiologist   Preanesthetic Checklist Completed: patient identified, site marked, surgical consent, pre-op evaluation, timeout performed, IV checked, risks and benefits discussed and monitors and equipment checked  Epidural Patient position: sitting Prep: site prepped and draped and DuraPrep Patient monitoring: continuous pulse ox and blood pressure Approach: midline Injection technique: LOR air  Needle:  Needle type: Tuohy  Needle gauge: 17 G Needle length: 9 cm and 9 Needle insertion depth: 6 cm Catheter type: closed end flexible Catheter size: 19 Gauge Catheter at skin depth: 11 cm Test dose: negative  Assessment Events: blood not aspirated, injection not painful, no injection resistance, negative IV test and no paresthesia  Additional Notes Discussed risk of headache, infection, bleeding, nerve injury and failed or incomplete block.  Patient voices understanding and wishes to proceed.  Epidural placed easily on first attempt.  No paresthesia.  Patient tolerated procedure well with no apparent complications.  Jasmine December, MDReason for block:procedure for pain

## 2013-04-11 NOTE — Progress Notes (Signed)
Chart reviewed with Dr. Macon Large and K. Shaw, cnm Pt does not have GD. Removed from her Hx.

## 2013-04-12 NOTE — Anesthesia Postprocedure Evaluation (Signed)
  Anesthesia Post-op Note  Patient: Kari Thomas  Procedure(s) Performed: * No procedures listed *  Patient Location: PACU and Mother/Baby  Anesthesia Type:Epidural  Level of Consciousness: awake  Airway and Oxygen Therapy: Patient Spontanous Breathing  Post-op Pain: mild  Post-op Assessment: Patient's Cardiovascular Status Stable and Respiratory Function Stable  Post-op Vital Signs: stable  Complications: No apparent anesthesia complications

## 2013-04-12 NOTE — Lactation Note (Signed)
This note was copied from the chart of Kari Thomas. Lactation Consultation Note  Initial visit with baby at 11 hours of age.  Mom reports feeding in previous hour for 15 minutes, but some nipple pain.  She hears swallows and sees milk in babies mouth.  Demonstrated cross cradle and football holds for latching to help guide baby with wide open mouth for deep latch to help prevent soreness.  Baby previously latching in cradle only.  Moms right nipple pink, but no breakdown.  Encouraged to express colostrum after feeding for nipple pain.  Demonstrated hand expression with visible colostrum.  Mom previously asked for a bottle, but didn't give it.  Encouraged mom to breastfeed on demand with cues, skin to skin.  Mom to call if needing help with positioning since baby is asleep at this time.  [facility] LC resources given and discussed.   Patient Name: Kari Ethelle Ola [id number] Date: 04/12/2013 Reason for consult: Other (Comment);Initial assessment;Breast/nipple pain (Spanish Interpreter Maday present)   Maternal Data Formula Feeding for Exclusion: Yes Reason for exclusion: Mother's choice to formula and breast feed on admission Has patient been taught Hand Expression?: Yes Does the patient have breastfeeding experience prior to this delivery?: Yes  Feeding Feeding Type: Breast Fed Length of feed: 15 min  LATCH Score/Interventions Latch: Grasps breast easily, tongue down, lips flanged, rhythmical sucking.  Audible Swallowing: A few with stimulation  Type of Nipple: Everted at rest and after stimulation  Comfort (Breast/Nipple): Filling, red/small blisters or bruises, mild/mod discomfort     Hold (Positioning): No assistance needed to correctly position infant at breast.  LATCH Score: 8  Lactation Tools Discussed/Used     Consult Status Consult Status: Follow-up Date: 04/13/13 Follow-up type: In-patient    Karynn Deblasi, Arvella Merles 04/12/2013, 5:14 PM

## 2013-04-12 NOTE — Progress Notes (Signed)
Post Partum Day 1 Subjective: no complaints, up ad lib, voiding, tolerating PO and + flatus  Objective: Blood pressure 106/61, pulse 80, temperature 98.2 F (36.8 C), temperature source Oral, resp. rate 18, height 5\' 4"  (1.626 m), weight 101.606 kg (224 lb), last menstrual period 07/13/2012, SpO2 98.00%, unknown if currently breastfeeding.  Physical Exam:  General: alert, cooperative, appears stated age and no distress Lochia: appropriate Uterine Fundus: firm DVT Evaluation: No evidence of DVT seen on physical exam. Negative Homan's sign. No cords or calf tenderness. No significant calf/ankle edema.   Recent Labs  04/11/13 1830 04/11/13 2210  HGB 12.3 12.2  HCT 35.5* 35.3*    Assessment/Plan: Plan for discharge tomorrow, Breastfeeding and Contraception condoms. All of this visit done with Spanish interpreter in the room.   LOS: 1 day   Hatcher Froning RYAN 04/12/2013, 7:42 AM

## 2013-04-13 MED ORDER — IBUPROFEN 600 MG PO TABS: 600.0000 mg | ORAL_TABLET | Freq: Four times a day (QID) | ORAL | Status: AC

## 2013-04-13 NOTE — Lactation Note (Signed)
This note was copied from the chart of Kari Thomas. Lactation Consultation Note  Patient Name: Kari Devlin Mcveigh [id number] Date: 04/13/2013 Reason for consult: Follow-up assessment  Visited with Mom and FOB on day of discharge, baby at 62 hrs old.  Mom was feeding baby in cradle hold using a scissor hold, with baby resting in her lap as no pillow support being used.  Baby was latched fairly well, but decided to show Mom how to use the cross cradle hold, as she had been complaining of soreness.  Added pillows, and guided Mom's hands to support her breast.  Baby at breast height, and Mom's hands placed on baby's head to facilitate a deeper latch onto breast.  Baby latched easily.  Showed Mom how to use alternate breast compression to keep baby stimulated to feed, and increase milk transfer.  Baby dressed in clothes as parents are ready to leave.  Mom also pulled her bra up and only exposed half of her breast, so encouraged her to remove bra altogether, or pull it all the way up.  Pediatrician appointment Monday, Dec. 1st.  Encouraged skin to skin, and feeding baby when he shows feeding cues.  To call our office prn.   Consult Status: Complete Date: 04/13/13 Follow-up type: Call as needed    Judee Clara 04/13/2013, 10:42 AM

## 2013-04-13 NOTE — Discharge Summary (Signed)
Obstetric Discharge Summary Reason for Admission: induction of labor for CHTN, no meds Prenatal Procedures: NST and ultrasound Intrapartum Procedures: spontaneous vaginal delivery Postpartum Procedures: none Complications-Operative and Postpartum: none Hospital Course:  Pt was admitted for IOL, had a smooth labor and delivery.  She never required antihypertensives. Filed Vitals:   04/13/13 0613  BP: 112/74  Pulse: 89  Temp: 97.5 F (36.4 C)  Resp: 20     Hemoglobin  Date Value Range Status  04/11/2013 12.2  12.0 - 15.0 g/dL Final  05/22/1094 04.5   Final     HCT  Date Value Range Status  04/11/2013 35.3* 36.0 - 46.0 % Final  09/18/2012 38   Final    Physical Exam:  General: alert, cooperative and no distress Lochia: appropriate Uterine Fundus: firm DVT Evaluation: No evidence of DVT seen on physical exam.  Discharge Diagnoses: Term Pregnancy-delivered CHTN not needing medicines at this time Discharge Information: Date: 04/13/2013 Activity: pelvic rest Diet: routine Medications: Ibuprofen Condition: stable Instructions: refer to practice specific booklet Discharge to: home   Newborn Data: Live born female  Birth Weight: 6 lb 11.8 oz (3056 g) APGAR: 9, 9  Home with mother.  CRESENZO-DISHMAN,Terrance Lanahan 04/13/2013, 7:35 AM

## 2013-05-21 ENCOUNTER — Ambulatory Visit (INDEPENDENT_AMBULATORY_CARE_PROVIDER_SITE_OTHER): Payer: Medicaid Other | Admitting: Obstetrics & Gynecology

## 2013-05-21 ENCOUNTER — Encounter: Payer: Self-pay | Admitting: Obstetrics & Gynecology

## 2013-05-21 NOTE — Progress Notes (Signed)
Patient ID: Kari Thomas, female   DOB: 09/12/1980, 33 y.o.   MRN: [id number] Subjective:     Kari Thomas is a 33 y.o. female who presents for a postpartum visit. She is 6 weeks postpartum following a spontaneous vaginal delivery. I have fully reviewed the prenatal and intrapartum course. The delivery was at term Outcome: spontaneous vaginal delivery. Postpartum course has been uneventful. Baby's course has been unremarkable. Baby is feeding by breast. Bleeding no bleeding. Bowel function is normal. Bladder function is normal. Patient is not sexually active. Contraception method is condoms. Postpartum depression screening: negative.  The following portions of the patient's history were reviewed and updated as appropriate: allergies, current medications, past family history, past medical history, past social history, past surgical history and problem list.  Review of Systems she c/o vulvar itching with no discharge.   Objective:    BP 133/93  Pulse 87  Wt 204 lb 8 oz (92.761 kg)  Breastfeeding? Yes  General:  alert and no distress               Vulva:  normal  Vagina: normal vagina, no discharge, exudate, lesion, or erythema  Cervix:  no cervical motion tenderness  Corpus: normal size, contour, position, consistency, mobility, non-tender  Adnexa:  no mass, fullness, tenderness  Rectal Exam: Not performed.        Assessment:    6 weeks postpartum exam. Pap smear not done at today's visit.   Plan:    1. Contraception: pt's husband planning for vasectomy 2. H/o elevated BP 3. Follow up in: 4 weeks or as needed.  To f/u in primary care for a BP check

## 2013-05-21 NOTE — Patient Instructions (Signed)
Hipertensión  (Hypertension)  Cuando el corazón late bombea la sangre a través de las arterias. La fuerza que se origina es la presión arterial. Si la presión es demasiado elevada, se denomina hipertensión. El peligro radica en que puede sufrirla y no saberlo. Hipertensión puede significar que su corazón debe trabajar más intensamente para bombear sangre. Las arterias pueden estar estrechas o rígidas. El trabajo extra aumenta el riesgo de enfermedades cardíacas, ictus y otros problemas.   La presión arterial está formada por dos números: el número mayor sobre el número menor, por ejemplo110/70. Se señala "110/72". Los valores ideales son por debajo de 120 para el número más alto (sistólica) y por debajo de 80 para el más bajo (diastólica). Anote su presión sanguínea hoy.  Debe prestar mucha atención a su presión arterial si sufre alguna otra enfermedad como:  · Insuficiencia cardíaca  · Ataques cardiacos previos  · Diabetes  · Enfermedad renal crónica  · Ictus previo  · Múltiples factores de riesgo para enfermedades cardíacas  Para diagnosticar si usted sufre hipertensión arterial, debe medirse la presión mientras encuentra sentado con el brazo elevado a la altura del nivel del corazón. Debe medirse al menos 2 veces. Una única lectura de presión arterial elevada (especialmente en el servicio de emergencias) no significa que necesita tratamiento. Hay enfermedades en las que la presión arterial es diferente en ambos brazos. Es importante que consulte rápidamente con su médico para un control.  La mayoría de las personas sufren hipertensión esencial, lo que significa que no tiene una causa específica. Este tipo de hipertensión puede bajarse modificando algunos factores en el estilo de vida como:  · Estrés.  · El consumo de cigarrillos.  · La falta de actividad física.  · Peso excesivo  · Consumo de drogas y alcohol.  · Consumiendo menos sal  La mayoría de las personas no tienen síntomas hasta que la hipertensión  ocasiona un daño en el organismo. El tratamiento efectivo puede evitar, demorar o reducir ese daño.  TRATAMIENTO:  El tratamiento para la hipertensión, cuando se ha identificado una causa, está dirigido a la misma Hay un gran número en medicamentos para tratarla. Se agrupan en diferentes categorías y el médico seleccionará los medicamentos indicados para usted. Muchos medicamentos disponibles tienen efectos secundarios. Debe comentar los efectos secundarios con su médico.  Si la presión arterial permanece elevada después de modificar su estilo de vida o comenzar a tomar medicamentos:  · Los medicamentos deben ser reemplazados  · Puede ser necesario evaluar otros problemas.  · Debe estar seguro que comprende las indicaciones, que sabe cómo y cuándo tomar los medicamentos.  · Asegúrese de realizar un control con su médico dentro del tiempo indicado (generalmente dentro de las dos semanas) para volver a evaluar la presión arterial y revisar los medicamentos prescritos.  · Si está tomando más de un medicamento para la presión arterial, asegúrese que sabe cómo y en qué momentos debe tomarlos. Tomar los medicamentos al mismo tiempo puede dar como resultado un gran descenso en la presión arterial.  SOLICITE ATENCIÓN MÉDICA DE INMEDIATO SI PRESENTA:  · Dolor de cabeza intenso, visión borrosa o cambios en la visión, o confusión.  · Debilidad o adormecimientos inusuales o sensación de desmayo.  · Dolor de pecho o abdominal intenso, vómitos o problemas para respirar.  ASEGÚRESE QUE:   · Comprende estas instrucciones.  · Controlará su enfermedad.  · Solicitará ayuda inmediatamente si no mejora o si empeora.  Document Released: 05/03/2005 Document Revised: 07/26/2011  [employer]®   Patient Information ©2014 [employer].

## 2013-05-23 ENCOUNTER — Encounter: Payer: Self-pay | Admitting: *Deleted

## 2014-01-30 IMAGING — US US OB FOLLOW-UP
1 series · 12 of 28 positions shown · non-contrast
Comparison: none

[Series 1: us ob follow-up · 0.19mm/px · 12 of 48 slices shown]
[im 2/48]
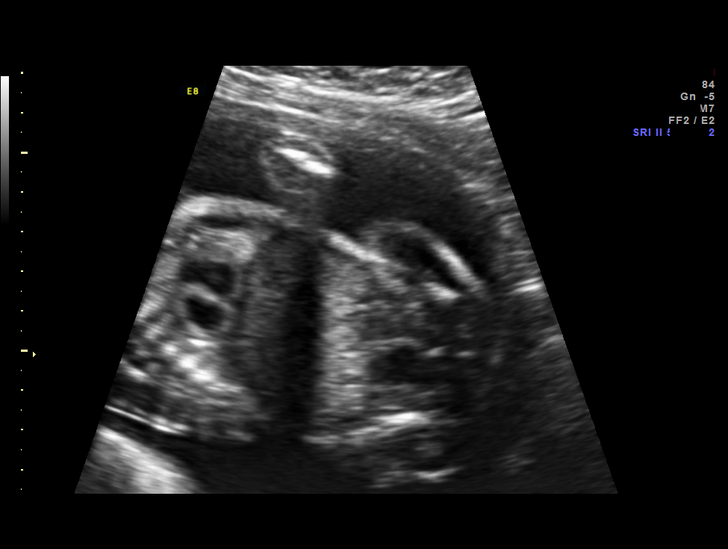
[im 6/48]
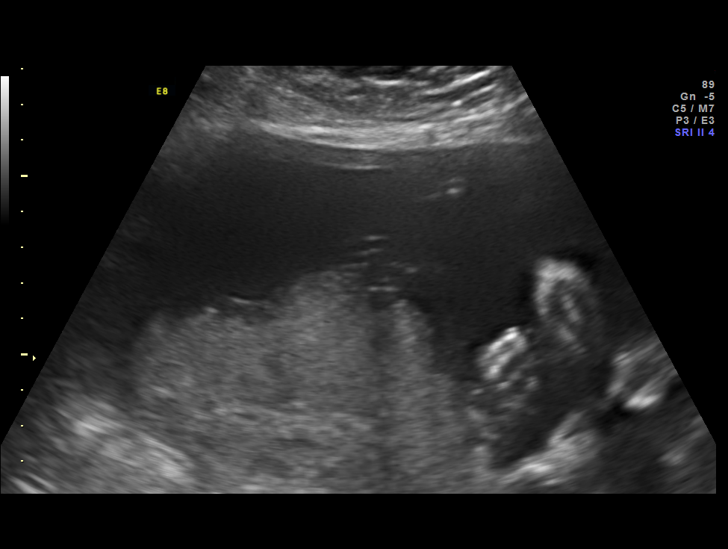
[im 9/48]
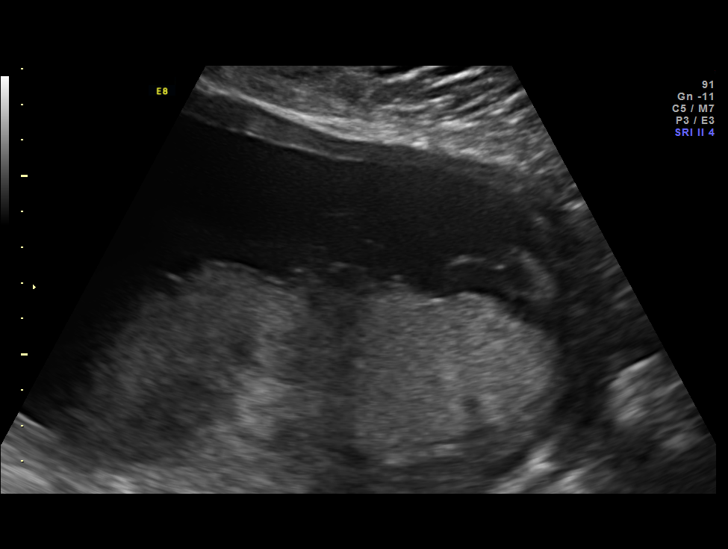
[im 14/48]
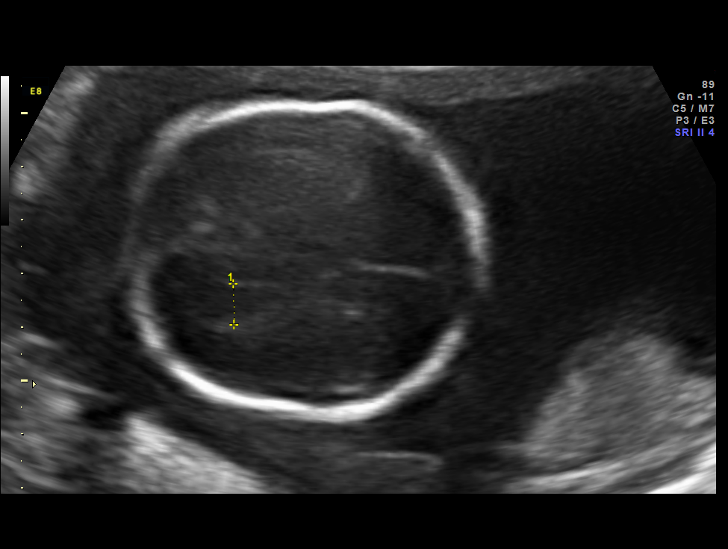
[im 18/48]
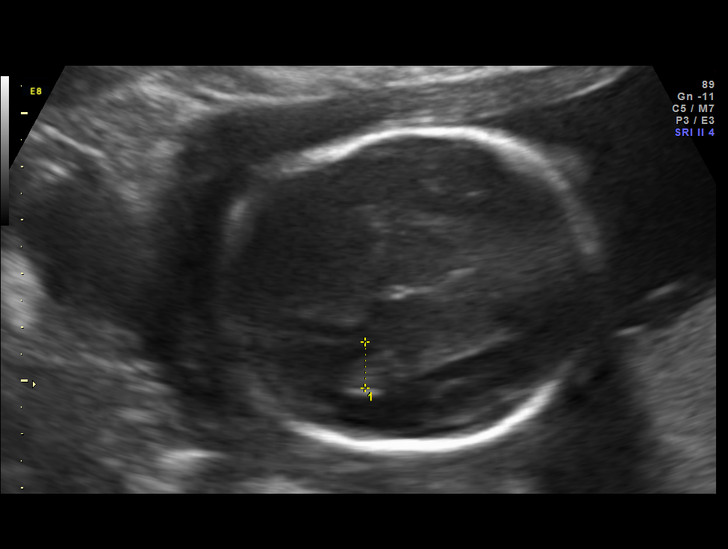
[im 21/48]
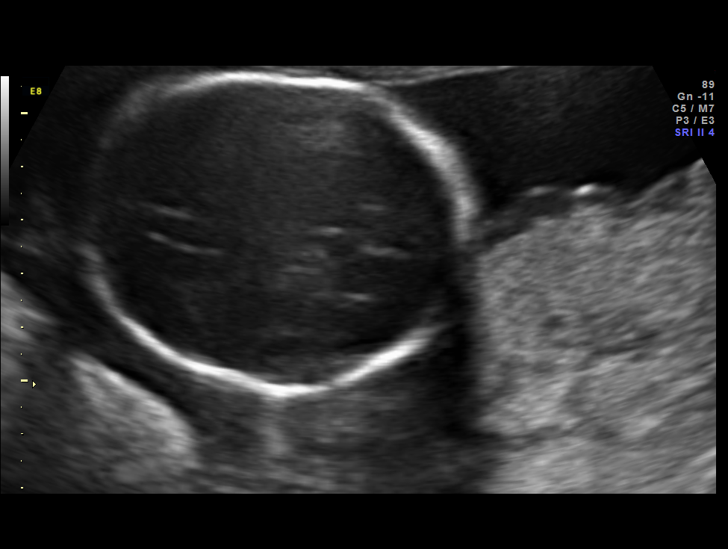
[im 27/48]
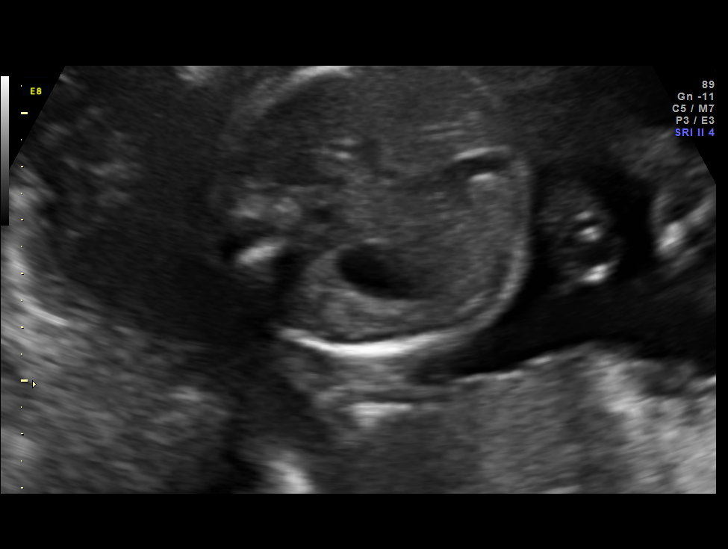
[im 30/48]
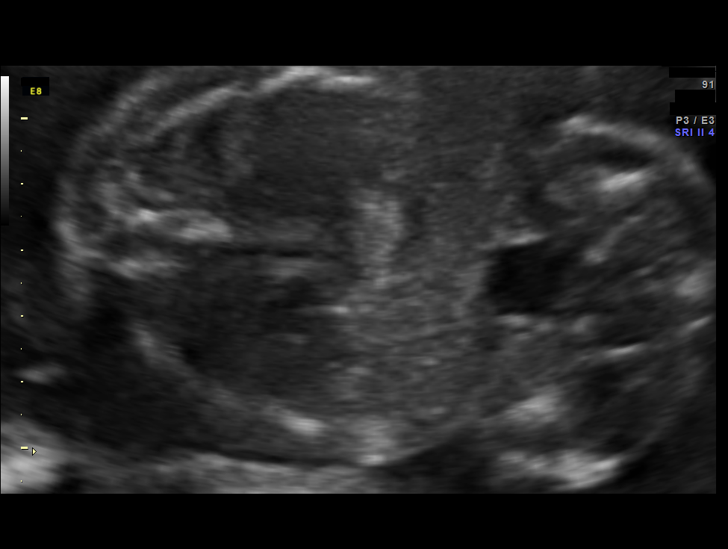
[im 34/48]
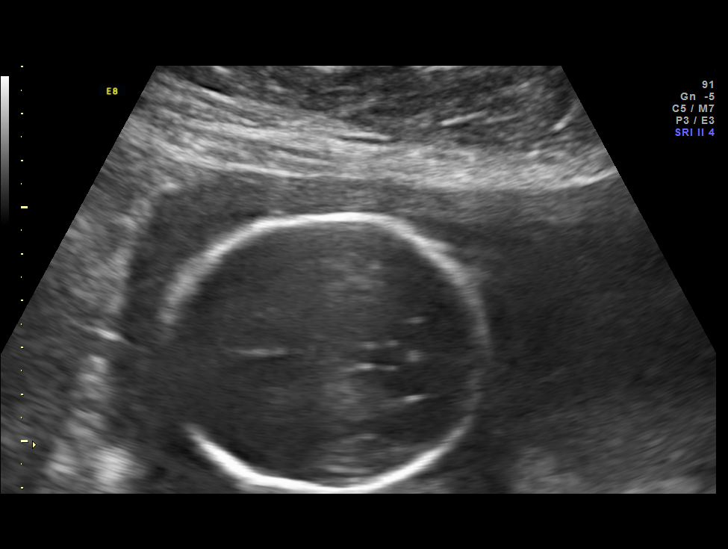
[im 39/48]
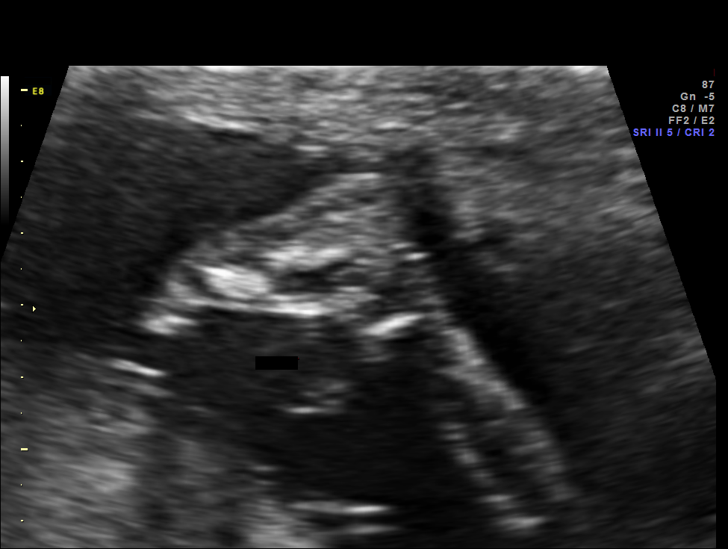
[im 42/48]
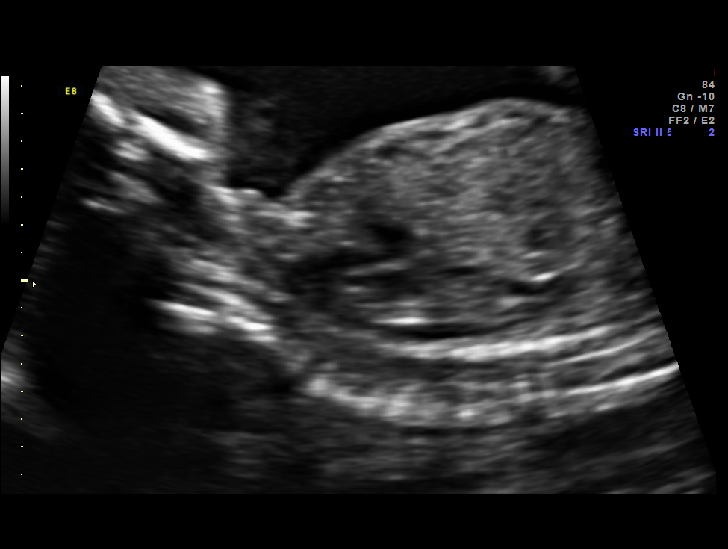
[im 46/48]
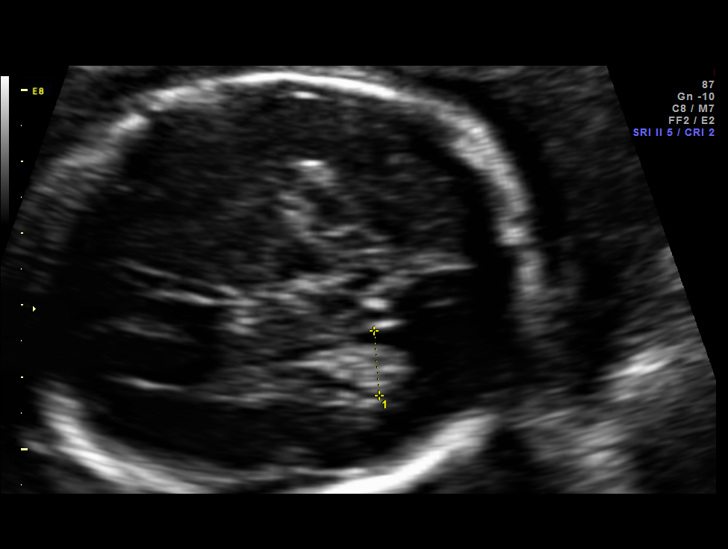

[12 of 28 positions shown; findings below may reference images not displayed]

OBSTETRICS REPORT
                      (Signed Final 12/20/2012 [DATE])

Service(s) Provided

 US OB FOLLOW UP                                       76816.1
Indications

 Hypertension - Chronic/Pre-existing
 Obesity complicating pregnancy                        649.13,
Fetal Evaluation

 Num Of Fetuses:    1
 Fetal Heart Rate:  135                          bpm
 Cardiac Activity:  Observed
 Presentation:      Breech
 Placenta:          Posterior, above cervical
                    os
 P. Cord            Visualized, central
 Insertion:

 Amniotic Fluid
 AFI FV:      Subjectively within normal limits
                                             Larg Pckt:    7.34  cm
Biometry

 BPD:     58.5  mm     G. Age:  24w 0d                CI:         76.5   70 - 86
 OFD:     76.5  mm                                    FL/HC:      18.7   18.7 -

 HC:     216.2  mm     G. Age:  23w 4d       21  %    HC/AC:      1.11   1.05 -

 AC:     195.1  mm     G. Age:  24w 1d       48  %    FL/BPD:     69.1   71 - 87
 FL:      40.4  mm     G. Age:  23w 0d       13  %    FL/AC:      20.7   20 - 24

 Est. FW:     622  gm      1 lb 6 oz     46  %
Gestational Age

 LMP:           22w 6d        Date:  07/13/12                 EDD:   04/19/13
 U/S Today:     23w 5d                                        EDD:   04/13/13
 Best:          24w 0d     Det. By:  U/S (12/06/12)           EDD:   04/11/13
Anatomy

 Cranium:          Appears normal         Aortic Arch:      Appears normal
 Fetal Cavum:      Appears normal         Ductal Arch:      Not well visualized
 Ventricles:       Appears normal         Diaphragm:        Appears normal
 Choroid Plexus:   Appears normal         Stomach:          Appears normal, left
                                                            sided
 Cerebellum:       Previously seen        Abdomen:          Appears normal
 Posterior Fossa:  Previously seen        Abdominal Wall:   Previously seen
 Nuchal Fold:      Not applicable (>20    Cord Vessels:     Previously seen
                   wks GA)
 Face:             Orbits and profile     Kidneys:          Appear normal
                   previously seen
 Lips:             Previously seen        Bladder:          Appears normal
 Palate:           Not well visualized    Spine:            Previously seen
 Heart:            Appears normal         Lower             Previously seen
                   (4CH, axis, and        Extremities:
                   situs)
 RVOT:             Not well visualized    Upper             Previously seen
                                          Extremities:
 LVOT:             Not well visualized

 Other:  Fetus appears to be a male.  Heels and 5th digit prev. seen.
Cervix Uterus Adnexa

 Cervical Length:    3.98     cm

 Cervix:       Normal appearance by transabdominal scan.
Impression

 Single IUP at 24 0/7 weeks
 Normal interval anatomy - somewhat limited views of the fetal
 heart obtained (outflow tracts, ductal arch) due to fetal
 position
 Fetal growth is appropriate (46th %tile)
 Normal amniotic fluid volume
Recommendations

 Recommend follow-up ultrasound examination in 4 weeks for
 growth and to reevaluate the fetal heart.

## 2014-02-27 IMAGING — US US OB FOLLOW-UP
1 series · 12 of 28 positions shown · non-contrast
Comparison: none

[Series 1: us ob follow-up · 12 of 56 slices shown]
[im 3/56]
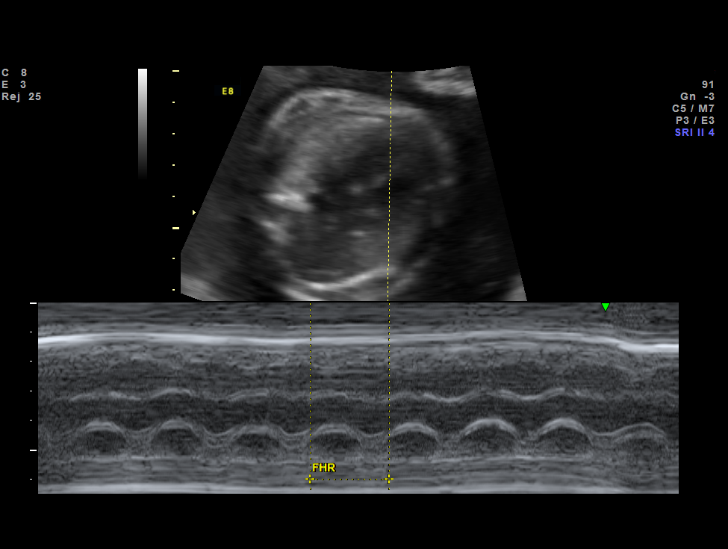
[im 7/56]
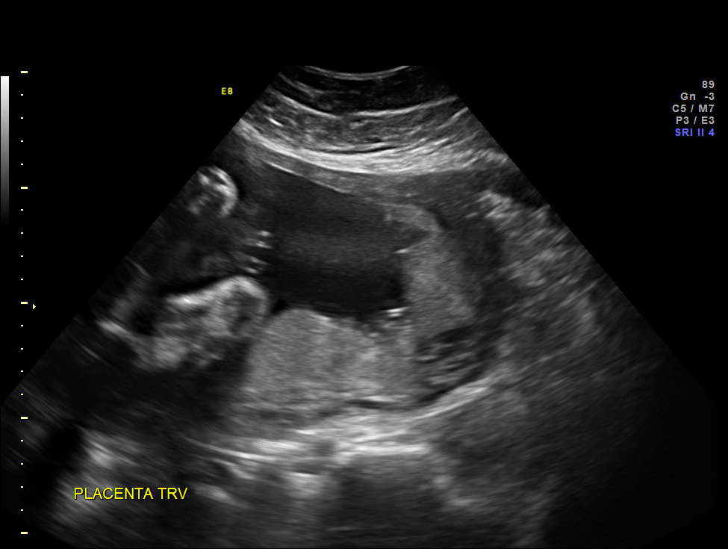
[im 11/56]
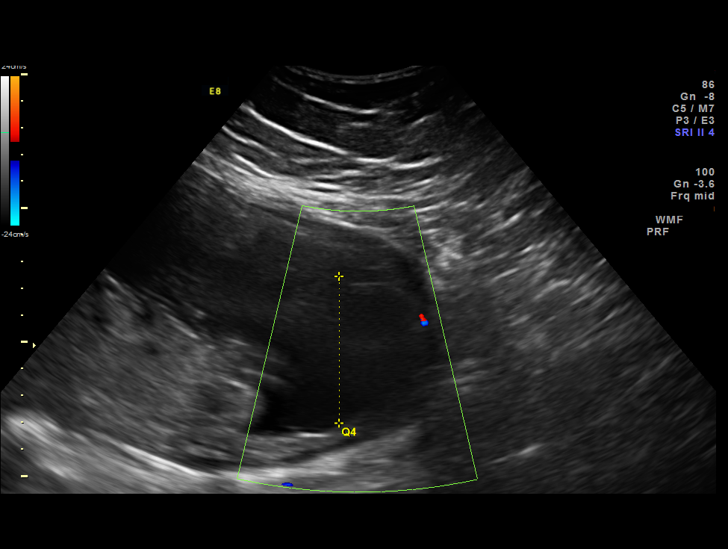
[im 17/56]
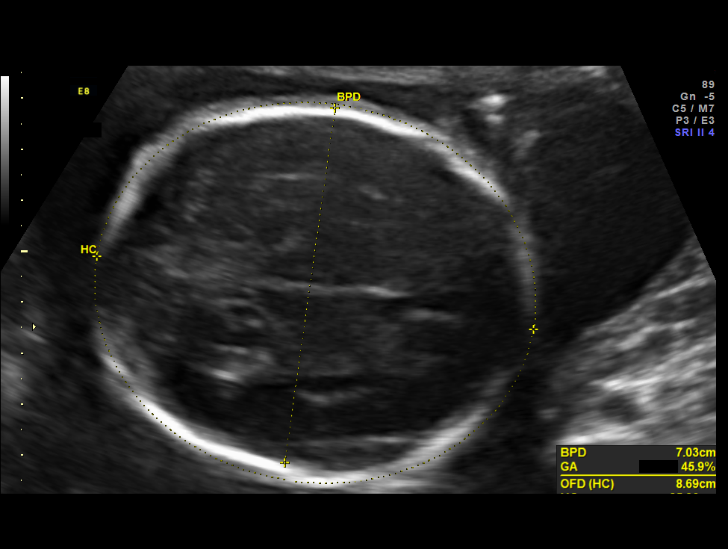
[im 21/56]
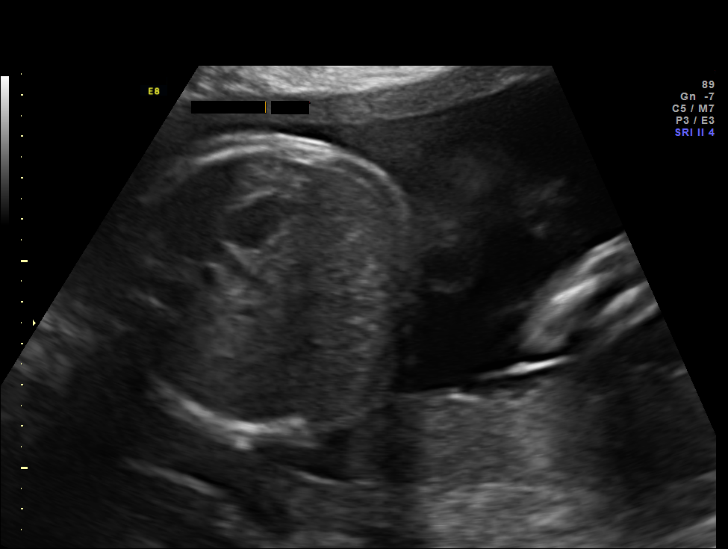
[im 25/56]
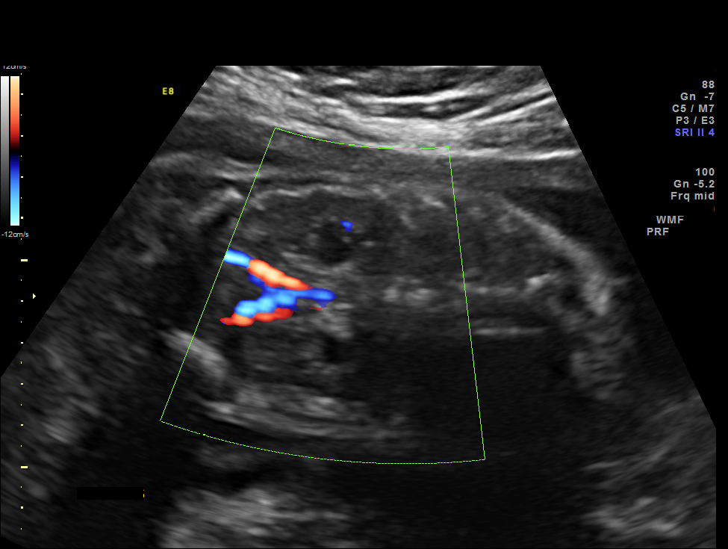
[im 31/56]
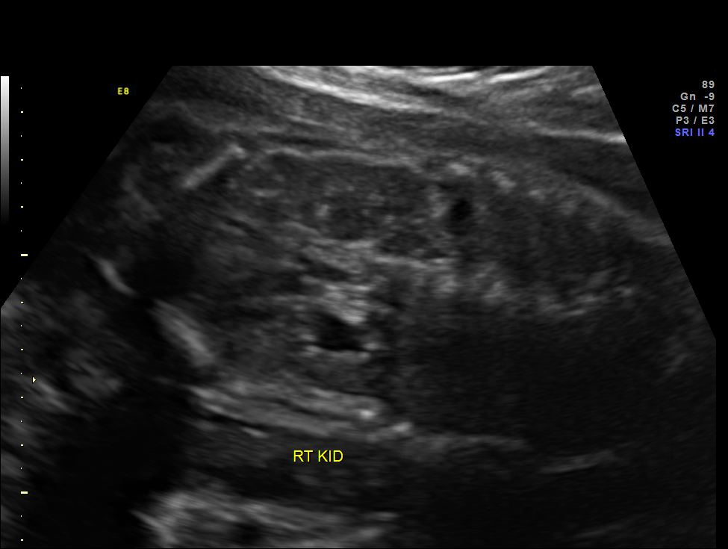
[im 35/56]
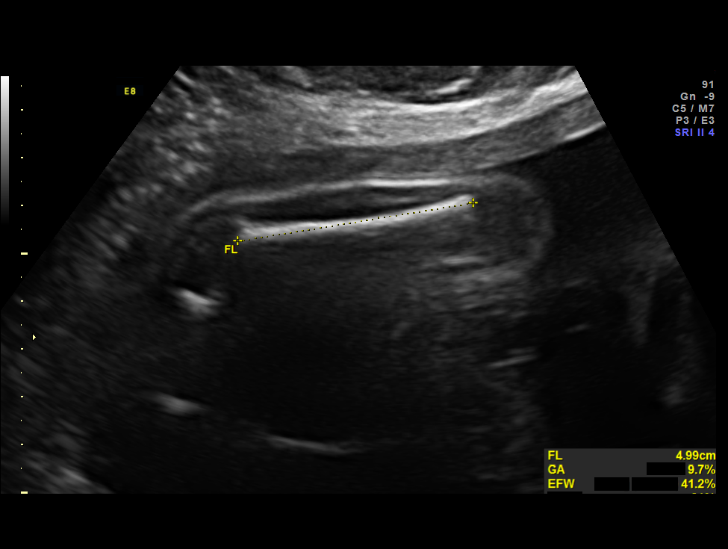
[im 39/56]
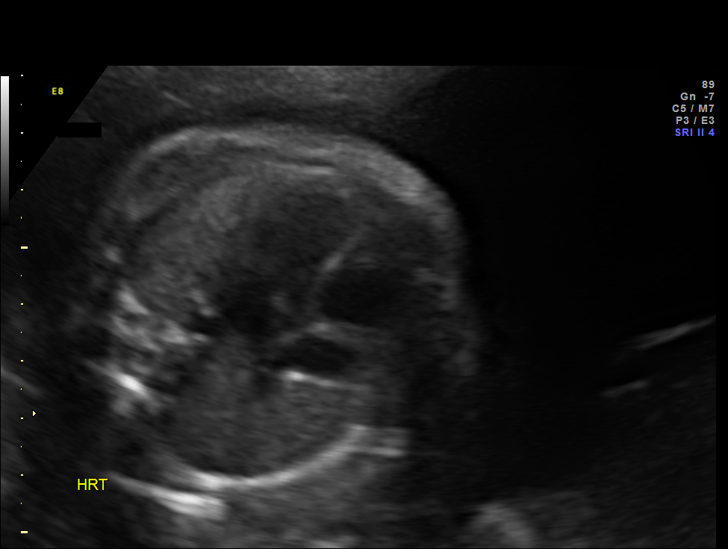
[im 45/56]
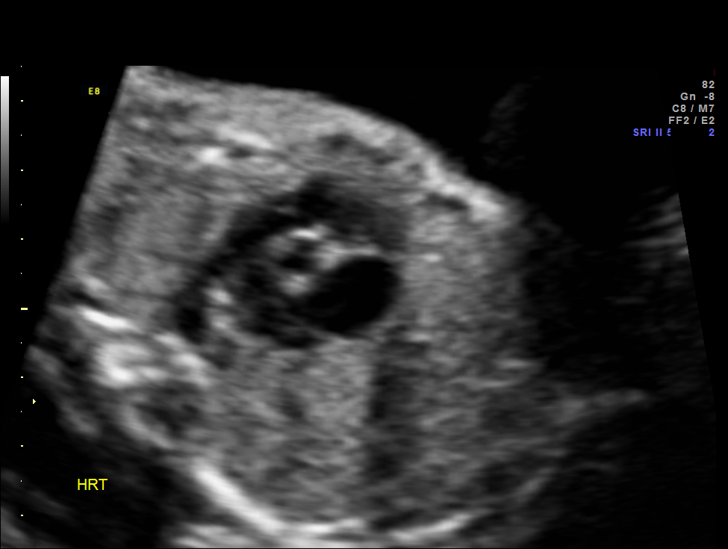
[im 49/56]
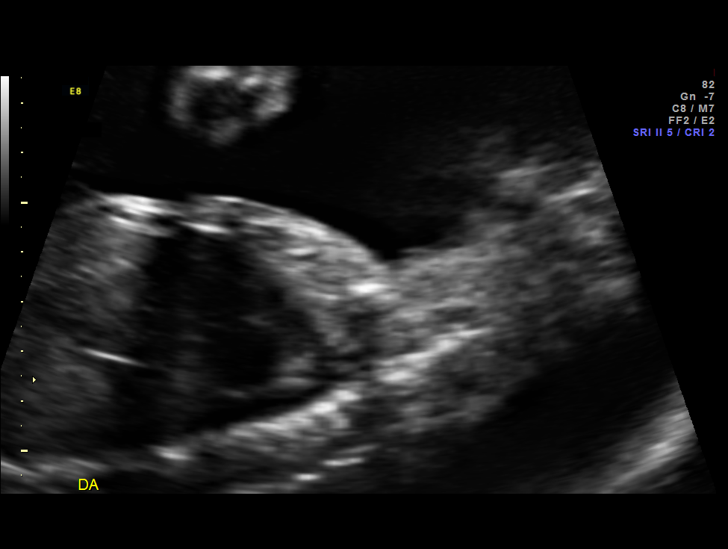
[im 53/56]
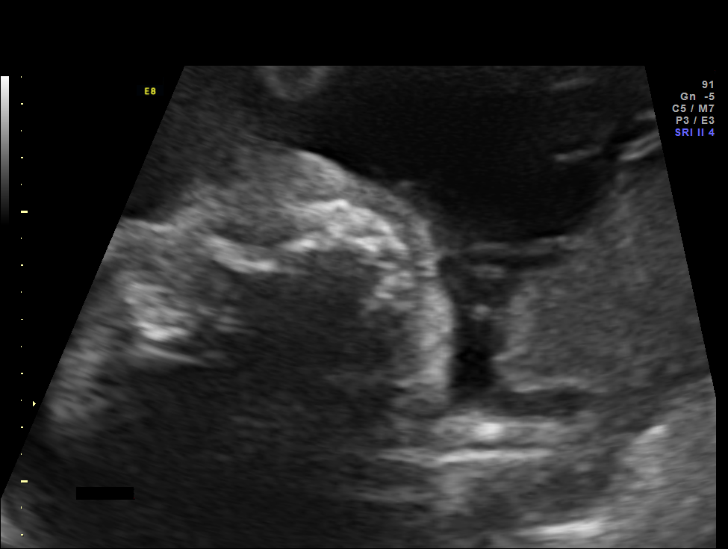

[12 of 28 positions shown; findings below may reference images not displayed]

OBSTETRICS REPORT
                      (Signed Final 01/17/2013 [DATE])

Service(s) Provided

 US OB FOLLOW UP                                       76816.1
Indications

 Hypertension - Chronic/Pre-existing
 Obesity complicating pregnancy                        649.13,
Fetal Evaluation

 Num Of Fetuses:    1
 Fetal Heart Rate:  127                          bpm
 Cardiac Activity:  Observed
 Presentation:      Cephalic
 Placenta:          Posterior left, above
                    cervical os
 P. Cord            Previously Visualized
 Insertion:

 Amniotic Fluid
 AFI FV:      Subjectively upper-normal
 AFI Sum:     24.1    cm       96  %Tile     Larg Pckt:    7.16  cm
 RUQ:   5.77    cm   RLQ:    5.49   cm    LUQ:   5.68    cm   LLQ:    7.16   cm
Biometry

 BPD:     70.4  mm     G. Age:  28w 2d                CI:         81.4   70 - 86
 OFD:     86.5  mm                                    FL/HC:      19.8   18.8 -

 HC:       253  mm     G. Age:  27w 4d       10  %    HC/AC:      1.08   1.05 -

 AC:     235.2  mm     G. Age:  27w 6d       38  %    FL/BPD:     71.0   71 - 87
 FL:        50  mm     G. Age:  26w 6d       10  %    FL/AC:      21.3   20 - 24
 HUM:     46.2  mm     G. Age:  27w 2d       30  %
 CER:     33.7  mm     G. Age:  29w 2d       69  %

 Est. FW:    5038  gm      2 lb 6 oz     41  %
Gestational Age

 LMP:           26w 6d        Date:  07/13/12                 EDD:   04/19/13
 U/S Today:     27w 5d                                        EDD:   04/13/13
 Best:          28w 0d     Det. By:  U/S (12/06/12)           EDD:   04/11/13
Anatomy

 Cranium:          Appears normal         Aortic Arch:      Previously seen
 Fetal Cavum:      Appears normal         Ductal Arch:      Appears normal
 Ventricles:       Appears normal         Diaphragm:        Previously seen
 Choroid Plexus:   Previously seen        Stomach:          Appears normal, left
                                                            sided
 Cerebellum:       Appears normal         Abdomen:          Appears normal
 Posterior Fossa:  Appears normal         Abdominal Wall:   Previously seen
 Nuchal Fold:      Not applicable (>20    Cord Vessels:     Previously seen
                   wks GA)
 Face:             Orbits and profile     Kidneys:          Appear normal
                   previously seen
 Lips:             Appears normal         Bladder:          Appears normal
 Palate:           Appears normal         Spine:            Previously seen
 Heart:            Appears normal         Lower             Previously seen
                   (4CH, axis, and        Extremities:
                   situs)
 RVOT:             Appears normal         Upper             Previously seen
                                          Extremities:
 LVOT:             Appears normal

 Other:  Fetus appears to be a male.  Heels and 5th digit previously seen.
         Nasal bone visualized.
Targeted Anatomy

 Fetal Central Nervous System
 Cisterna Magna:
Cervix Uterus Adnexa

 Cervical Length:    3.5      cm

 Cervix:       Normal appearance by transabdominal scan.

 Left Ovary:    Within normal limits.
 Right Ovary:   Not visualized.
 Adnexa:     No abnormality visualized.
Impression

 Single IUP at 28 0/7 weeks
 Mild / borderline renal pylectasis noted (4-5 mm bilaterally)
 Interval anatomy is otherwise within normal limits- fetal
 survey now complete
 Fetal growth is appropriate (41st %tile)
 Subjectively increased amniotic fluid volume (AFI = 24 cm)

 BPs: 153/101; repeat 120/74
Recommendations

 Recommend follow-up ultrasound examination in 4 weeks -
 will reevaluate fetal kidneys at that time.

## 2014-03-18 ENCOUNTER — Encounter: Payer: Self-pay | Admitting: Obstetrics & Gynecology
